# Patient Record
Sex: Male | Born: 1980
Health system: Southern US, Community
[De-identification: ages and names within clinical notes are randomized; demographics above are authoritative.]

## PROBLEM LIST (undated history)

## (undated) DIAGNOSIS — J309 Allergic rhinitis, unspecified: Secondary | ICD-10-CM

## (undated) DIAGNOSIS — G4730 Sleep apnea, unspecified: Secondary | ICD-10-CM

## (undated) HISTORY — DX: Allergic rhinitis, unspecified: J30.9

## (undated) HISTORY — DX: Sleep apnea, unspecified: G47.30

---
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
## 1999-02-26 ENCOUNTER — Emergency Department (HOSPITAL_COMMUNITY): Admission: EM | Admit: 1999-02-26 | Discharge: 1999-02-26 | Payer: Self-pay | Admitting: Emergency Medicine

## 2005-09-09 ENCOUNTER — Ambulatory Visit: Payer: Self-pay | Admitting: Family Medicine

## 2006-03-09 ENCOUNTER — Encounter: Payer: Self-pay | Admitting: Emergency Medicine

## 2011-01-25 ENCOUNTER — Ambulatory Visit (INDEPENDENT_AMBULATORY_CARE_PROVIDER_SITE_OTHER): Payer: BC Managed Care – PPO | Admitting: Internal Medicine

## 2011-01-25 ENCOUNTER — Encounter: Payer: Self-pay | Admitting: Internal Medicine

## 2011-01-25 DIAGNOSIS — K625 Hemorrhage of anus and rectum: Secondary | ICD-10-CM | POA: Insufficient documentation

## 2011-02-04 NOTE — Assessment & Plan Note (Signed)
Summary: BLOOD IN STOOL   Vital Signs:  Patient Profile:   30 Years Old Male CC:      blood in stool yesterday Height:     67 inches Weight:      240 pounds Temp:     98.8 degrees F Pulse rate:   92 / minute Resp:     14 per minute BP sitting:   130 / 85  (left arm)  Pt. in pain?   no                   Prior Medication List:  No prior medications documented  Current Allergies: No known allergies History of Present Illness Chief Complaint: blood in stool yesterday History of Present Illness: noticed blood in toilet water yesterday. Doesn't think it was on stool but stool has been looser as he has been on diet. Had a few episodes of blood on toilet in past years but thought it was from wiping too hard. No abd pain but sl rectal burning. Lifts alot at work and sits on toilet for long periods.  REVIEW OF SYSTEMS Constitutional Symptoms      Denies fever, chills, night sweats, weight loss, weight gain, and fatigue.  Eyes       Denies change in vision, eye pain, eye discharge, glasses, contact lenses, and eye surgery. Ear/Nose/Throat/Mouth       Denies hearing loss/aids, change in hearing, ear pain, ear discharge, dizziness, frequent runny nose, frequent nose bleeds, sinus problems, sore throat, hoarseness, and tooth pain or bleeding.  Respiratory       Denies dry cough, productive cough, wheezing, shortness of breath, asthma, bronchitis, and emphysema/COPD.  Cardiovascular       Denies murmurs, chest pain, and tires easily with exhertion.    Gastrointestinal       Complains of blood in bowel movements.      Denies stomach pain, nausea/vomiting, diarrhea, constipation, and indigestion. Genitourniary       Denies painful urination, kidney stones, and loss of urinary control. Neurological       Denies paralysis, seizures, and fainting/blackouts. Musculoskeletal       Denies muscle pain, joint pain, joint stiffness, decreased range of motion, redness, swelling, muscle  weakness, and gout.  Skin       Denies bruising, unusual mles/lumps or sores, and hair/skin or nail changes.  Psych       Denies mood changes, temper/anger issues, anxiety/stress, speech problems, depression, and sleep problems.  Past History:  Family History: Last updated: 01/25/2011 Family History Hypertension  Social History: Last updated: 01/25/2011 Former Smoker quit 2/12 Alcohol use-yes Drug use-no  Past Medical History: Unremarkable  Past Surgical History: Denies surgical history  Family History: Family History Hypertension  Social History: Former Smoker quit 2/12 Alcohol use-yes Drug use-no Smoking Status:  quit Drug Use:  no Physical Exam General appearance: well developed, well nourished, no acute distress Head: normocephalic, atraumatic Pupils: equal, round, reactive to light Oral/Pharynx: tongue normal, posterior pharynx without erythema or exudate Neck: neck supple,  trachea midline, no masses Chest/Lungs: no rales, wheezes, or rhonchi bilateral, breath sounds equal without effort Abdomen: soft, non-tender without obvious organomegaly. rectal without external abnl, rectal suboptimal due to pt tenseness. stool heme neg Extremities: normal extremities Neurological: grossly intact and non-focal Skin: no obvious rashes MSE: oriented to time, place, and person Assessment New Problems: RECTAL BLEEDING (ICD-569.3)   The patient and/or caregiver has been counseled thoroughly with regard to medications prescribed including dosage, schedule,  interactions, rationale for use, and possible side effects and they verbalize understanding.  Diagnoses and expected course of recovery discussed and will return if not improved as expected or if the condition worsens. Patient and/or caregiver verbalized understanding.   Patient Instructions: 1)  get hemoccult developed at [facility] and call me with results. 2)  Watch bm's and if stool is black or bloody, consult your  primary at [facility].  3)  During a lift of weight, inhale slowly. 4)  Don't spend time on toilet. 5)  If rectum burns, apply rectal cream

## 2011-02-09 NOTE — Letter (Signed)
Summary: History Form   History Form   Imported By: Eugenio Hoes 02/01/2011 12:08:07  _____________________________________________________________________  External Attachment:    Type:   Image     Comment:   External Document

## 2011-04-28 ENCOUNTER — Encounter: Payer: Self-pay | Admitting: Family Medicine

## 2011-05-11 ENCOUNTER — Ambulatory Visit: Payer: Self-pay | Admitting: Family Medicine

## 2011-07-06 ENCOUNTER — Telehealth: Payer: Self-pay | Admitting: *Deleted

## 2011-07-06 ENCOUNTER — Ambulatory Visit (INDEPENDENT_AMBULATORY_CARE_PROVIDER_SITE_OTHER): Payer: BC Managed Care – PPO | Admitting: Family Medicine

## 2011-07-06 ENCOUNTER — Encounter: Payer: Self-pay | Admitting: Family Medicine

## 2011-07-06 DIAGNOSIS — F17200 Nicotine dependence, unspecified, uncomplicated: Secondary | ICD-10-CM

## 2011-07-06 DIAGNOSIS — F419 Anxiety disorder, unspecified: Secondary | ICD-10-CM | POA: Insufficient documentation

## 2011-07-06 DIAGNOSIS — R631 Polydipsia: Secondary | ICD-10-CM | POA: Insufficient documentation

## 2011-07-06 DIAGNOSIS — F411 Generalized anxiety disorder: Secondary | ICD-10-CM

## 2011-07-06 DIAGNOSIS — R634 Abnormal weight loss: Secondary | ICD-10-CM

## 2011-07-06 DIAGNOSIS — IMO0001 Reserved for inherently not codable concepts without codable children: Secondary | ICD-10-CM

## 2011-07-06 LAB — CBC WITH DIFFERENTIAL/PLATELET
Basophils Absolute: 0 10*3/uL (ref 0.0–0.1)
Basophils Relative: 0.5 % (ref 0.0–3.0)
Eosinophils Absolute: 0 10*3/uL (ref 0.0–0.7)
Eosinophils Relative: 0.4 % (ref 0.0–5.0)
HCT: 47.9 % (ref 39.0–52.0)
Hemoglobin: 16 g/dL (ref 13.0–17.0)
Lymphocytes Relative: 22.1 % (ref 12.0–46.0)
Lymphs Abs: 1.5 10*3/uL (ref 0.7–4.0)
MCHC: 33.5 g/dL (ref 30.0–36.0)
MCV: 90.2 fl (ref 78.0–100.0)
Monocytes Absolute: 0.4 10*3/uL (ref 0.1–1.0)
Monocytes Relative: 5.2 % (ref 3.0–12.0)
Neutro Abs: 4.8 10*3/uL (ref 1.4–7.7)
Neutrophils Relative %: 71.8 % (ref 43.0–77.0)
Platelets: 278 10*3/uL (ref 150.0–400.0)
RBC: 5.32 Mil/uL (ref 4.22–5.81)
RDW: 12.8 % (ref 11.5–14.6)
WBC: 6.7 10*3/uL (ref 4.5–10.5)

## 2011-07-06 LAB — TSH: TSH: 0.78 u[IU]/mL (ref 0.35–5.50)

## 2011-07-06 LAB — COMPREHENSIVE METABOLIC PANEL
ALT: 33 U/L (ref 0–53)
AST: 26 U/L (ref 0–37)
Albumin: 4.9 g/dL (ref 3.5–5.2)
Alkaline Phosphatase: 48 U/L (ref 39–117)
BUN: 10 mg/dL (ref 6–23)
CO2: 28 mEq/L (ref 19–32)
Calcium: 9.7 mg/dL (ref 8.4–10.5)
Chloride: 103 mEq/L (ref 96–112)
Creatinine, Ser: 1 mg/dL (ref 0.4–1.5)
GFR: 115.94 mL/min (ref >60.00)
Glucose, Bld: 102 mg/dL — ABNORMAL HIGH (ref 70–99)
Potassium: 4.3 mEq/L (ref 3.5–5.1)
Sodium: 140 mEq/L (ref 135–145)
Total Bilirubin: 0.6 mg/dL (ref 0.3–1.2)
Total Protein: 7.8 g/dL (ref 6.0–8.3)

## 2011-07-06 LAB — HEMOGLOBIN A1C: Hgb A1c MFr Bld: 6.1 % (ref 4.6–6.5)

## 2011-07-06 MED ORDER — ALPRAZOLAM 0.5 MG PO TABS: 0.5000 mg | ORAL_TABLET | Freq: Every day | ORAL | Status: DC | PRN

## 2011-07-06 NOTE — Patient Instructions (Signed)
Eat small amounts of healthy food  Labs today  - and will update you  Avoid caffine and alcohol Keep working on quitting smoking  If you feel worse at any time let me know  Take xanax for extreme anxiety only if you need it  Keep exercising  Follow up with me in 1-2 weeks

## 2011-07-06 NOTE — Assessment & Plan Note (Signed)
This is new Worrisome for DM esp in light of wt loss  Lab today incl random sugar and a1c Disc at f/u

## 2011-07-06 NOTE — Telephone Encounter (Signed)
I will write him a work note for today and put in in IN box

## 2011-07-06 NOTE — Assessment & Plan Note (Signed)
New onset in last several months accompanied by nausea/ vomiting and wt loss  Also some mild depressive symptoms without SI  Labs today for thyroid/ cbc/ sugar - then f/u 1-2 wk to disc  Xanax px given for emergencies (and warnings given)- but pt will not likely take  Will make plan at f/u Enc exercise

## 2011-07-06 NOTE — Progress Notes (Signed)
Subjective:    Patient ID: Joseph Maddox, male    DOB: 1981-04-07, 30 y.o.   MRN: [id number]  HPI Here to re establish for care  Has not been here since he graduated high school   Has lost 17 lb since march - not on purpose  ? Eating less because he is nervous   Wanted to disc anxiety and sadness A highschool friend died in July 14, 2023 (not even a very close friend)  This ? Affected him  Then had an issue at work - with a collegue wkend of aug 10th -- he was taking care of his daughter and noticed he was more nervous than usual (31 yo and her brother 3y) Never been nervous like this before - just not right emotionally   Also feels sad at times -- emotional and feels like crying - for no reason (cried at church on Sunday)   Has been having trouble with nausea and vomiting for the past week  ? 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)  Starting to feel too nervous to drive   Quit smoking regular cigarettes in July - now is smoking small cigarellos -- with filters -- was down to 3 per day  Is getting off of that  Can no longer tolerate alcohol - used to drink regularly   He feels like his voice sounds different and things look different  No hallucinations / paranoia / or delusions that he is aware of   In family - aunt may have anxiety issues   No drugs at all  Takes vitamins and no otc meds Exercise is helping his anxiety -- P90X -- that helps him relax afterwards   Also extremely thirsty and urinates a lot  Frequent stools too   Patient Active Problem List  Diagnoses  . Anxiety  . Weight loss  . Excessive thirst   No past medical history on file. No past surgical history on file. History  Substance Use Topics  . Smoking status: Current Everyday Smoker    Types: Cigars  . Smokeless tobacco: Not on file  . Alcohol Use: Yes   Family History  Problem Relation Age of Onset  . Hypertension Other      Fam hx  . Hypertension Mother   . Hypertension Father   . Stroke Maternal Grandmother   . Cancer Maternal Grandfather     prostate CA   No Known Allergies No current outpatient prescriptions on file prior to visit.       Review of Systems Review of Systems  Constitutional: Negative for fever, appetite change, fatigue and unexpected weight change.  Eyes: Negative for pain and visual disturbance.  Respiratory: Negative for cough and shortness of breath.   Cardiovascular: Negative.  for cp but pos for racing heart occ when very nervous  Gastrointestinal: Negative for nausea, and constipation. and abd pain  Genitourinary: Negative for urgency and frequency.  Skin: Negative for pallor. or rash  Neurological: Negative for weakness, light-headedness, numbness and headaches.  Hematological: Negative for adenopathy. Does not bruise/bleed easily.  Psychiatric/Behavioral:pos for symptoms of anxiety and depression , neg for depression          Objective:   Physical Exam  Constitutional: He appears well-developed and well-nourished. No distress.       overwt and well appearing   HENT:  Head: Normocephalic and atraumatic.  Nose: Nose normal.  Mouth/Throat:  Oropharynx is clear and moist.  Eyes: Conjunctivae and EOM are normal. Pupils are equal, round, and reactive to light.  Neck: Normal range of motion. Neck supple. No JVD present. Carotid bruit is not present. No thyromegaly present.  Cardiovascular: Normal rate, regular rhythm, normal heart sounds and intact distal pulses.   No murmur heard. Pulmonary/Chest: Breath sounds normal. No respiratory distress. He has no wheezes. He exhibits no tenderness.  Abdominal: Soft. Bowel sounds are normal. He exhibits no distension and no mass. There is no tenderness.  Musculoskeletal: Normal range of motion. He exhibits no edema and no tenderness.  Lymphadenopathy:    He has no cervical adenopathy.  Neurological: He is alert. He has normal  reflexes. No cranial nerve deficit. Coordination normal.       No tremor  Nl sensation in fingers and toes   Skin: Skin is warm and dry. No rash noted. No erythema.  Psychiatric:       Pt seems mildly anxious and also with blunted affect  Overall talkative and with fair eye contact Good communication skills No SI          Assessment & Plan:

## 2011-07-06 NOTE — Telephone Encounter (Signed)
Patient notified as instructed by telephone. Pt said he did not need note for work he was wanting to know what he should do about his vomiting after he eats at work. Pt said he works from [place] until 5:30pm each day. Pt said not feeling anxious today but he ate a protein bar and drank ensure about 2:00pm and then he vomited at 3:45pm. Pt said he works in [employer] but the last week it has not been hot where he works. Pt said he is trying to eat smaller amts of food but he still vomited this afternoon. Please advise. Pt understands due to lateness of today he may not get call back until Wed. Pt can be reached at [phone] and uses [facility] if pharmacy needed.

## 2011-07-06 NOTE — Telephone Encounter (Signed)
I don't know what the treatment plan will be until I get his labs back and have him follow up  For now - advise him to get some zantac 150 mg otc and try it bid for stomach acid  I will get back to him about results and f/u as planned

## 2011-07-06 NOTE — Telephone Encounter (Signed)
Patient called to let Dr. Milinda Antis know that he is at work and is vomiting.  He stated that Dr. Milinda Antis did not give him a work note to be out of work and he wanted to know what he should do.  Please advise.

## 2011-07-06 NOTE — Telephone Encounter (Signed)
Patient notified as instructed by telephone. 

## 2011-07-06 NOTE — Assessment & Plan Note (Signed)
With mood change/ anx and dep and also some exercise and food intol Lab today Disc at f/u  Lost 17 lb since march

## 2011-07-13 ENCOUNTER — Telehealth: Payer: Self-pay | Admitting: *Deleted

## 2011-07-13 MED ORDER — ALPRAZOLAM 0.5 MG PO TABS: 0.5000 mg | ORAL_TABLET | Freq: Two times a day (BID) | ORAL | Status: DC | PRN

## 2011-07-13 NOTE — Telephone Encounter (Signed)
Pt is asking if he can take his xanax twice a day instead of once.  When he takes one a day it doesn't last the whole day.  He has tried taking half twice a day, but half doesn't work as well as a whole one.  Please advise.

## 2011-07-13 NOTE — Telephone Encounter (Signed)
[facility], patient advised as instructed via telephone.

## 2011-07-13 NOTE — Telephone Encounter (Signed)
That is ok until f/u  Px written for call in   Will disc further at f/u  Careful of sedation

## 2011-07-20 ENCOUNTER — Ambulatory Visit (INDEPENDENT_AMBULATORY_CARE_PROVIDER_SITE_OTHER): Payer: BC Managed Care – PPO | Admitting: Family Medicine

## 2011-07-20 ENCOUNTER — Encounter: Payer: Self-pay | Admitting: Family Medicine

## 2011-07-20 DIAGNOSIS — R7309 Other abnormal glucose: Secondary | ICD-10-CM

## 2011-07-20 DIAGNOSIS — R739 Hyperglycemia, unspecified: Secondary | ICD-10-CM

## 2011-07-20 DIAGNOSIS — R631 Polydipsia: Secondary | ICD-10-CM

## 2011-07-20 DIAGNOSIS — R634 Abnormal weight loss: Secondary | ICD-10-CM

## 2011-07-20 DIAGNOSIS — F419 Anxiety disorder, unspecified: Secondary | ICD-10-CM

## 2011-07-20 DIAGNOSIS — F411 Generalized anxiety disorder: Secondary | ICD-10-CM

## 2011-07-20 DIAGNOSIS — F17200 Nicotine dependence, unspecified, uncomplicated: Secondary | ICD-10-CM

## 2011-07-20 DIAGNOSIS — R7303 Prediabetes: Secondary | ICD-10-CM | POA: Insufficient documentation

## 2011-07-20 MED ORDER — BUSPIRONE HCL 15 MG PO TABS: 7.5000 mg | ORAL_TABLET | Freq: Two times a day (BID) | ORAL | Status: DC

## 2011-07-20 NOTE — Progress Notes (Signed)
Subjective:    Patient ID: Joseph Maddox, male    DOB: 05/12/1981, 30 y.o.   MRN: [id number]  HPI Here for f/u of anxiety and wt loss and hyperglycemia   Last time - did full lab prof for anx and n/v Labs all nl except for hyperglycemia with a1c of 6.1  Was having excessive thirst   Xanax - for anx has helped  Was apprehensive about taking it at first  It really calms down his anxiety - takes it twice  No mental illness in the family  Overall sadness has been better too  Vomiting and stomach problems are better   No stressors - in terms of counseling   He does have some snoring - ? Poss apnea Not tired during the day    Wt is down 3 more lb He did some research on DM and started eating 6 small meals per day - he does feel better with that  Has a protien supplement if she misses a meal  Eats a lot of fruit  More protien and fruit and veg -much healthier Has plan to start exercise   Patient Active Problem List  Diagnoses  . Anxiety  . Weight loss  . Excessive thirst  . Smoking  . Hyperglycemia   No past medical history on file. No past surgical history on file. History  Substance Use Topics  . Smoking status: Current Everyday Smoker    Types: Cigars  . Smokeless tobacco: Not on file  . Alcohol Use: Yes   Family History  Problem Relation Age of Onset  . Hypertension Other     Fam hx  . Hypertension Mother   . Hypertension Father   . Stroke Maternal Grandmother   . Cancer Maternal Grandfather     prostate CA   No Known Allergies Current Outpatient Prescriptions on File Prior to Visit  Medication Sig Dispense Refill  . ALPRAZolam (XANAX) 0.5 MG tablet Take 1 tablet (0.5 mg total) by mouth 2 (two) times daily as needed for anxiety.  30 tablet  0  . Ascorbic Acid (VITAMIN C) 1000 MG tablet Take 1,000 mg by mouth daily.        . cyanocobalamin 2000 MCG tablet Take 2,000 mcg by mouth daily.        . Multiple Vitamin (MULTIVITAMIN) tablet Take 2 tablets  by mouth daily.             Review of Systems Review of Systems  Constitutional: Negative for fever, appetite change, fatigue and unexpected weight change.  Eyes: Negative for pain and visual disturbance.  ENT pos for some allergy sinus congestion  Respiratory: Negative for cough and shortness of breath.   Cardiovascular: Negative for cp or palpitations    Gastrointestinal: Negative for nausea, diarrhea and constipation.  Genitourinary: Negative for urgency and frequency.  Skin: Negative for pallor or rash   Neurological: Negative for weakness, light-headedness, numbness and headaches.  Hematological: Negative for adenopathy. Does not bruise/bleed easily.  Psychiatric/Behavioral: Negative for dysphoric mood. The patient is not nervous/anxious.          Objective:   Physical Exam  Constitutional: He appears well-developed and well-nourished. No distress.       overwt and well appearing   HENT:  Head: Normocephalic and atraumatic.  Mouth/Throat: Oropharynx is clear and moist.  Eyes: Conjunctivae and EOM are normal. Pupils are equal, round, and reactive to light.  Neck: Normal range of motion. Neck supple. No JVD present. Carotid bruit  is not present. No thyromegaly present.  Cardiovascular: Normal rate, regular rhythm, normal heart sounds and intact distal pulses.   Pulmonary/Chest: Effort normal and breath sounds normal. No respiratory distress. He has no wheezes.  Abdominal: Soft. Bowel sounds are normal. He exhibits no distension and no mass. There is no tenderness.  Musculoskeletal: He exhibits no edema and no tenderness.  Lymphadenopathy:    He has no cervical adenopathy.  Neurological: He is alert. He has normal reflexes. No cranial nerve deficit. Coordination normal.       No tremor   Skin: Skin is warm and dry. No rash noted. No erythema. No pallor.  Psychiatric: He has a normal mood and affect.       Much more relaxed - smiles at times Good eye contact and  communication skills           Assessment & Plan:

## 2011-07-20 NOTE — Assessment & Plan Note (Signed)
This has improved and GI issues resolved with xanax  Will transition over to buspar 7.5 bid and gradually stop xanax  Disc poss side eff and will update F/u 6-8 weeks Will begin back with exercise too

## 2011-07-20 NOTE — Assessment & Plan Note (Signed)
Rev labs in detail with pt  Disc imp of wt loss - low glycemic diet and exercise Disc plan for this  F/u 6-8 weeks and will continue to follow

## 2011-07-20 NOTE — Assessment & Plan Note (Signed)
Likely with hyperglycemia  This has imp with better diet

## 2011-07-20 NOTE — Assessment & Plan Note (Signed)
Overall is starting to loose intentionally with better low glycemic diet  Enc this

## 2011-07-20 NOTE — Assessment & Plan Note (Signed)
Disc in detail risks of smoking and possible outcomes including copd, vascular/ heart disease, cancer , respiratory and sinus infections  Pt voices understanding He is cutting back and doing well with less anxiety

## 2011-07-20 NOTE — Patient Instructions (Signed)
Eat small frequent meals with protien  Avoid sugar and sweets  Start working out at least 5 days per week  Take a multivitamin without caffiene  Start buspar twice daily for anxiety and depression  If any side effects or if you feel worse - let me know Gradually get off the xanax - as you feel better  Follow up with me in 6-8 weeks (or earlier if needed )  Also - keep working on quitting smoking

## 2011-07-21 ENCOUNTER — Telehealth: Payer: Self-pay | Admitting: *Deleted

## 2011-07-21 NOTE — Telephone Encounter (Signed)
That is a common side effect and usually goes away quickly -- but let me know if it worsens or other problems thanks

## 2011-07-21 NOTE — Telephone Encounter (Signed)
Patient took Buspar last night and again this morning.  He says he felt "wierd" this morning after taking it, kind of like he was "high" but it only lasted a short while.  I told him that it would probably take some time for his body to adjust to it.  He was fine with that, just curious if this could be the effects of the medicine.  Please advise otherwise.

## 2011-07-21 NOTE — Telephone Encounter (Signed)
Patient notified as instructed by telephone. 

## 2011-07-21 NOTE — Telephone Encounter (Signed)
Patient says he started the Buspar and wants to know how it makes you feel.

## 2011-07-26 ENCOUNTER — Telehealth: Payer: Self-pay | Admitting: *Deleted

## 2011-07-26 MED ORDER — ALPRAZOLAM ER 0.5 MG PO TB24: 0.5000 mg | ORAL_TABLET | ORAL | Status: DC

## 2011-07-26 NOTE — Telephone Encounter (Signed)
I'm hoping that his buspar will start working so he will need xanax less In meantime we can change to long acting xanax xr for longer lasting releif Warm - like the other this is habit forming so use only when necessary PLAN:

## 2011-07-26 NOTE — Telephone Encounter (Signed)
[facility] Triage Call Report Triage Record Num: [id number] Operator: Ether Griffins Patient Name: Joseph Maddox Call Date & Time: 07/23/2011 6:12:59PM Patient Phone: [phone] PCP: Evelena Peat Patient Gender: Male PCP Fax : [phone] Patient DOB: [birth year] Practice Name: [facility] Reason for Call: Calling about starting on Xanax 07/06/11 and Buspar 07/20/11. Feels good when he first takes meds but doesn't feel good when it wears off--hard to breathe due to anxiety but then feels fine when taking Xanax. All emergent sxs of Allergic reaction r/o.Home care advice given.Advised to call office back if sxs get worse. Protocol(s) Used: Allergic Reaction, Severe Recommended Outcome per Protocol: Provide Home/Self Care Reason for Outcome: All other situations Care Advice: ~ 07/23/2011 6:29:46PM Page 1 of 1 [id number]

## 2011-07-26 NOTE — Telephone Encounter (Signed)
Pt states xanax is not helping like it was, not lasting as long and wearing off sooner.  He's taking 2 a day.  Should he increase his dose?  Please advise.

## 2011-07-26 NOTE — Telephone Encounter (Signed)
Tell pt I reviewed his note -- from call a nurse  Continue buspar and take xanax when he needs it also - hopefully as time goes on he will need less and less xanax  I want to give it some more time But update me if further changes

## 2011-07-26 NOTE — Telephone Encounter (Signed)
Patient notified as instructed by telephone. 

## 2011-07-27 NOTE — Telephone Encounter (Signed)
Patient notified as instructed by telephone. Pt advised not to take Xanax and Xanax XR together. Pt to try Xanax XR. Medication phoned to [facility]. pharmacy as instructed.

## 2011-07-28 ENCOUNTER — Telehealth: Payer: Self-pay | Admitting: *Deleted

## 2011-07-28 MED ORDER — ALPRAZOLAM 0.5 MG PO TABS: 0.5000 mg | ORAL_TABLET | Freq: Three times a day (TID) | ORAL | Status: DC | PRN

## 2011-07-28 MED ORDER — BUSPIRONE HCL 15 MG PO TABS: 15.0000 mg | ORAL_TABLET | Freq: Two times a day (BID) | ORAL | Status: DC

## 2011-07-28 NOTE — Telephone Encounter (Signed)
Patient notified as instructed by telephone. Pt said he does not have the money to pick up the Buspar today. Pt said he is presently taking Buspar 7.5 mg one tab bid. Pt will increase Buspar 7.5mg  to two tabs bid. Spoke with Zella Ball at [facility] and Buspar 15mg  not available now so if pt takes two 7.5 mg tabs twice a day that will equal what Dr Milinda Antis wants pt to take. Zella Ball will profile Buspar 15 mg in computer for future refills.

## 2011-07-28 NOTE — Telephone Encounter (Signed)
Then go ahead and increase buspar to one whole pill bid (currently on 1/2 bid) and will see how he is doing by his next appt Px written for call in

## 2011-07-28 NOTE — Telephone Encounter (Signed)
Patient notified as instructed by telephone. Pt said he cannot tell if Buspar is working or not but he does not feel like having any side effects from Buspar. Medication phoned to [facility]. pharmacy as instructed.

## 2011-07-28 NOTE — Telephone Encounter (Signed)
Pt's xanax was changed to XR.  He took one this morning and doesn't like the way it makes him feel.  It has made him sleepy, anxious.  The plain didn't make him feel this way, it just didn't last long enough.  He doesn't like this, this makes him feel high.

## 2011-07-28 NOTE — Telephone Encounter (Signed)
Ok- then stop the xanax xr  Go back to the plain xanax .5 and can take 3 times per day instead of 2  Watch out for sedation I will write a new px for call in  Ask him if he thinks the buspar is working at all and if he is having any side effects also

## 2011-08-03 ENCOUNTER — Telehealth: Payer: Self-pay | Admitting: Family Medicine

## 2011-08-03 NOTE — Telephone Encounter (Signed)
I'm glad he is feeling better That amount of fish is ok

## 2011-08-03 NOTE — Telephone Encounter (Signed)
Pt called to notify doctor that he is eating 2 cans of 3 oz Tuna and serving of Tilapia and Salmon daily and was calling to ask if this was okay for him and mercury levels.  Please call patient to discuss at [phone].

## 2011-08-03 NOTE — Telephone Encounter (Signed)
Spoke with pt and the 2 cans of Tuna at lunch at a serving of Tilapia and/or Salmon at supper meal is basically all pt is eating right now. Pt wants to make sure that is OK with Dr Milinda Antis. Pt said he is still taking Buspar 7.5 mg twice a day and has not taken Xanax since last week and is feeling better.

## 2011-08-03 NOTE — Telephone Encounter (Signed)
Patient notified as instructed by telephone. Pt wondered if needed to stick with diet all the time and I looked at last OV and pt is to follow low glycemic diet.

## 2011-08-18 ENCOUNTER — Telehealth: Payer: Self-pay | Admitting: *Deleted

## 2011-08-18 NOTE — Telephone Encounter (Signed)
Patient notified as instructed by telephone. 

## 2011-08-18 NOTE — Telephone Encounter (Signed)
Pt states he has been taking 7.5 mg's of buspar twice a day.  He has not increased to 15 mg's twice a day yet, and wonders if he needs to.  I asked him how he feels and he said he is not taking xanax anymore.  He thinks the buspar is working but says he has been irritable lately and has been smoking more.  He wants your opinion on whether he should increase his dose.

## 2011-08-18 NOTE — Telephone Encounter (Signed)
Tell him to go ahead and try the increase to 15 bid --if it does not agree with him he can drop back, but I think it may help He has f/u later this month

## 2011-08-31 ENCOUNTER — Encounter: Payer: Self-pay | Admitting: Family Medicine

## 2011-08-31 ENCOUNTER — Ambulatory Visit (INDEPENDENT_AMBULATORY_CARE_PROVIDER_SITE_OTHER): Payer: BC Managed Care – PPO | Admitting: Family Medicine

## 2011-08-31 VITALS — BP 104/78 | HR 76 | Temp 98.4°F | Ht 64.75 in | Wt 218.2 lb

## 2011-08-31 DIAGNOSIS — Z23 Encounter for immunization: Secondary | ICD-10-CM

## 2011-08-31 DIAGNOSIS — F411 Generalized anxiety disorder: Secondary | ICD-10-CM

## 2011-08-31 DIAGNOSIS — R0981 Nasal congestion: Secondary | ICD-10-CM

## 2011-08-31 DIAGNOSIS — F419 Anxiety disorder, unspecified: Secondary | ICD-10-CM

## 2011-08-31 DIAGNOSIS — R7309 Other abnormal glucose: Secondary | ICD-10-CM

## 2011-08-31 DIAGNOSIS — R739 Hyperglycemia, unspecified: Secondary | ICD-10-CM

## 2011-08-31 DIAGNOSIS — F17200 Nicotine dependence, unspecified, uncomplicated: Secondary | ICD-10-CM

## 2011-08-31 DIAGNOSIS — J3489 Other specified disorders of nose and nasal sinuses: Secondary | ICD-10-CM

## 2011-08-31 NOTE — Patient Instructions (Addendum)
Work up to more days of exercise per week Stay on the buspar 15 mg twice daily  Work on smoking cessation  Flu shot today  Schedule follow up in 6 months with labs prior  claritin or allegra are ok for allergy/ mucous problems , also nasal saline spray

## 2011-08-31 NOTE — Progress Notes (Signed)
Subjective:    Patient ID: Joseph Maddox, male    DOB: Sep 12, 1981, 30 y.o.   MRN: [id number]  HPI Here for f/u of anxiety and hyperglycemia and smoking   Wt is down 2 lb with bmi of 36  Diet - on point-- doing much better than he was - small frequent meals   Exercise 2 days per week and working on increasing it  Hard to find the time   Hyperglycemia - working on that with diet - and it is going well  Feels better without sugar   Smoking status- up and down -- smokes cigarellos -- is feeling relaxed enough to cut back and quit again  No cough or trouble breathing  Is ok with    Anxiety -- was on xanax and transitioned over to buspar On oct 11- started increase of buspar 15 mg bid - and feeling better  No longer needing xanax  Overall feeling much much better  Every once in a while a little fuzzy- but it goes away  No side effects overall  Is affirming his stress and working better with it    Is having some allergy congestion and cough with mucous occas  What can he try otc?  All mucous is clear No sinus pain or fever  Patient Active Problem List  Diagnoses  . Anxiety  . Weight loss  . Excessive thirst  . Smoking  . Hyperglycemia  . Congestion of nasal sinus   No past medical history on file. No past surgical history on file. History  Substance Use Topics  . Smoking status: Current Everyday Smoker    Types: Cigars  . Smokeless tobacco: Not on file  . Alcohol Use: Yes   Family History  Problem Relation Age of Onset  . Hypertension Other     Fam hx  . Hypertension Mother   . Hypertension Father   . Stroke Maternal Grandmother   . Cancer Maternal Grandfather     prostate CA   No Known Allergies Current Outpatient Prescriptions on File Prior to Visit  Medication Sig Dispense Refill  . Ascorbic Acid (VITAMIN C) 1000 MG tablet Take 1,000 mg by mouth daily.        . cyanocobalamin 2000 MCG tablet Take 2,000 mcg by mouth daily.        . Multiple  Vitamin (MULTIVITAMIN) tablet Take 1 tablet by mouth daily.       Marland Kitchen ALPRAZolam (XANAX) 0.5 MG tablet Take 1 tablet (0.5 mg total) by mouth 3 (three) times daily as needed for sleep or anxiety.  90 tablet  0       Review of Systems Review of Systems  Constitutional: Negative for fever, appetite change, fatigue and unexpected weight change.  Eyes: Negative for pain and visual disturbance.  ENT pos for runny and stuffy nose, no ST  Respiratory: Negative for cough and shortness of breath.   Cardiovascular: Negative for cp or palpitations    Gastrointestinal: Negative for nausea, diarrhea and constipation.  Genitourinary: Negative for urgency and frequency.  Skin: Negative for pallor or rash   Neurological: Negative for weakness, light-headedness, numbness and headaches.  Hematological: Negative for adenopathy. Does not bruise/bleed easily.  Psychiatric/Behavioral: Negative for dysphoric mood. The patient is much less anxious           Objective:   Physical Exam  Constitutional: He appears well-developed and well-nourished. No distress.  HENT:  Head: Normocephalic and atraumatic.  Mouth/Throat: Oropharynx is clear and moist.  Nares are boggy and pale No sinus tenderness  Eyes: Conjunctivae and EOM are normal. Pupils are equal, round, and reactive to light.  Neck: Normal range of motion. Neck supple. No tracheal deviation present. No thyromegaly present.  Cardiovascular: Normal rate, regular rhythm, normal heart sounds and intact distal pulses.   Pulmonary/Chest: Effort normal and breath sounds normal. No respiratory distress. He has no wheezes.       Diffusely distant bs   Musculoskeletal: Normal range of motion. He exhibits no edema and no tenderness.  Lymphadenopathy:    He has no cervical adenopathy.  Neurological: He is alert. He has normal reflexes. No cranial nerve deficit. Coordination normal.  Skin: Skin is warm and dry. No rash noted. No erythema. No pallor.    Psychiatric: He has a normal mood and affect.       Much improved anxiety- at ease/ talkative          Assessment & Plan:

## 2011-09-05 NOTE — Assessment & Plan Note (Signed)
Suspect allergies/hayfever Recommend otc antihist and nasal saline spray Update if worse or not imp

## 2011-09-05 NOTE — Assessment & Plan Note (Signed)
Doing well with exercise and low glycemic diet  Commended

## 2011-09-05 NOTE — Assessment & Plan Note (Signed)
Much imp with buspar 15 bid  No longer on xanax Disc coping tech and exercise for stress

## 2011-09-19 ENCOUNTER — Other Ambulatory Visit: Payer: Self-pay | Admitting: Family Medicine

## 2011-09-20 NOTE — Telephone Encounter (Signed)
[facility] request refill Buspar 7.5 mg. Since Dr Milinda Antis is out of office and is not on her computer today can you please advise about refill?

## 2011-09-21 ENCOUNTER — Other Ambulatory Visit: Payer: Self-pay | Admitting: Family Medicine

## 2011-09-21 NOTE — Telephone Encounter (Signed)
I'm confused, was this already done today?

## 2011-09-21 NOTE — Telephone Encounter (Signed)
rx sent

## 2011-10-20 ENCOUNTER — Other Ambulatory Visit: Payer: Self-pay | Admitting: Family Medicine

## 2011-10-21 NOTE — Telephone Encounter (Signed)
Please ask pt if he is taking 2 of the 7.5 buspar bid or one 15 bid --since the pharmacy is asking for refil  thanks

## 2011-10-22 NOTE — Telephone Encounter (Signed)
Please verify with pt whether he is taking the 15 mg bid or 2 of the 7.5s -- I will refer whichever he prefers-thanks

## 2011-10-22 NOTE — Telephone Encounter (Signed)
OK to refill

## 2011-10-22 NOTE — Telephone Encounter (Signed)
rx sent to pharmacy by e-script Uses 7.5

## 2011-10-26 ENCOUNTER — Other Ambulatory Visit: Payer: Self-pay | Admitting: *Deleted

## 2011-10-26 NOTE — Telephone Encounter (Signed)
I think I just did this 12/12 ?

## 2011-10-29 NOTE — Telephone Encounter (Signed)
Yes it was done.

## 2011-11-18 ENCOUNTER — Other Ambulatory Visit: Payer: Self-pay | Admitting: Family Medicine

## 2011-11-18 NOTE — Telephone Encounter (Signed)
[facility] 15 [facility] and that is why 12/12 refill was for Buspar 7.5 mg taking 2 bid. [facility] and [facility] request 15 mg. So Buspar 15 mg #60 x 4 [facility] [address]

## 2012-02-14 ENCOUNTER — Telehealth: Payer: Self-pay

## 2012-02-14 NOTE — Telephone Encounter (Signed)
Since weight lifting supplements are generally not studied or approved by the FDA- I cannot tell him weather there could be interaction .... I'm not a huge fan of those kind of medications in general  I adv he stop those supplements but bring them to his f/u with me so I can review the ingredients  thanks

## 2012-02-14 NOTE — Telephone Encounter (Signed)
Pt takes Buspar total of 30 mg daily. Pt concerned if interaction if pt starts to take wt lifting supplements: HMB and glutamine and Branch chain amino acid. Pt already has appt scheduled with Dr Milinda Antis 03/01/12. Pt last seen 08/31/11. Pt can be reached [phone].

## 2012-02-14 NOTE — Telephone Encounter (Signed)
Patient advised as instructed via telephone, will discuss with Dr. Milinda Antis at f/u appt.

## 2012-03-01 ENCOUNTER — Encounter: Payer: Self-pay | Admitting: Family Medicine

## 2012-03-01 ENCOUNTER — Ambulatory Visit (INDEPENDENT_AMBULATORY_CARE_PROVIDER_SITE_OTHER): Payer: BC Managed Care – PPO | Admitting: Family Medicine

## 2012-03-01 VITALS — BP 112/78 | HR 81 | Temp 98.8°F | Ht 64.75 in | Wt 246.2 lb

## 2012-03-01 DIAGNOSIS — F411 Generalized anxiety disorder: Secondary | ICD-10-CM

## 2012-03-01 DIAGNOSIS — Z87891 Personal history of nicotine dependence: Secondary | ICD-10-CM

## 2012-03-01 DIAGNOSIS — R739 Hyperglycemia, unspecified: Secondary | ICD-10-CM

## 2012-03-01 DIAGNOSIS — R7309 Other abnormal glucose: Secondary | ICD-10-CM

## 2012-03-01 DIAGNOSIS — F419 Anxiety disorder, unspecified: Secondary | ICD-10-CM

## 2012-03-01 NOTE — Assessment & Plan Note (Signed)
Much imp with buspar and better health habits  Will get back to diet / exercise for 3 mo  Then if doing well and stress level is stable - will work on getting off the buspar

## 2012-03-01 NOTE — Patient Instructions (Addendum)
Start at the gym , be cautious with supplements  Great job quitting smoking  Watch diet and work on [employer] loss  Schedule follow up with labs prior in 3 months  If all is going well we will work on tapering down the buspar

## 2012-03-01 NOTE — Progress Notes (Signed)
Subjective:    Patient ID: Joseph Maddox, male    DOB: [birth year], 31 y.o.   MRN: [id number]  HPI Here for f/u of anxiety and smoking and hyperglycemia  Wt is up 28 lb with bmi of 41  On buspar 15 mg bid  Anxiety-- doing great - anxiety under good control  Feeling more like himself  Would like to start weaning off of it soon  Not a lot of stress currently   Smoking -- quit in October -- is very proud  Was difficult at first and used his faith to get him through Also no alcohol since august  immed gained 20 lb after quitting smoking - and now is intent on getting it off   Hyperglycemia  Last a1c 6.1 Wt gain- noted- working on getting it off  Exercise- just started going to the gym 2 weeks ago  May be getting some hyperglycemia  Has cut out sweets completely -and only drinking water and crystal light -- that started recently   Patient Active Problem List  Diagnoses  . Anxiety  . Hyperglycemia  . Quit smoking   No past medical history on file. No past surgical history on file. History  Substance Use Topics  . Smoking status: Current Everyday Smoker    Types: Cigars  . Smokeless tobacco: Not on file  . Alcohol Use: Yes   Family History  Problem Relation Age of Onset  . Hypertension Other     Fam hx  . Hypertension Mother   . Hypertension Father   . Stroke Maternal Grandmother   . Cancer Maternal Grandfather     prostate CA   No Known Allergies Current Outpatient Prescriptions on File Prior to Visit  Medication Sig Dispense Refill  . busPIRone (BUSPAR) 15 MG tablet TAKE 1 TABLET TWICE A DAY  60 tablet  4  . Multiple Vitamin (MULTIVITAMIN) tablet Take 1 tablet by mouth daily.             Review of Systems Review of Systems  Constitutional: Negative for fever, appetite change, fatigue and unexpected weight change.  Eyes: Negative for pain and visual disturbance.  Respiratory: Negative for cough and shortness of breath.   Cardiovascular: Negative  for cp or palpitations    Gastrointestinal: Negative for nausea, diarrhea and constipation.  Genitourinary: Negative for urgency and frequency.  Skin: Negative for pallor or rash   Neurological: Negative for weakness, light-headedness, numbness and headaches.  Hematological: Negative for adenopathy. Does not bruise/bleed easily.  Psychiatric/Behavioral: Negative for dysphoric mood. The patient is not nervous/anxious.  (this is much improved)        Objective:   Physical Exam  Constitutional: He appears well-developed and well-nourished. No distress.  HENT:  Head: Normocephalic and atraumatic.  Mouth/Throat: Oropharynx is clear and moist.  Eyes: Conjunctivae and EOM are normal. Pupils are equal, round, and reactive to light. No scleral icterus.  Neck: Normal range of motion. Neck supple. No JVD present. Carotid bruit is not present. No thyromegaly present.  Cardiovascular: Normal rate, regular rhythm, normal heart sounds and intact distal pulses.  Exam reveals no gallop.   Pulmonary/Chest: Effort normal and breath sounds normal. No respiratory distress. He has no wheezes.       Good air exch  Abdominal: Soft. Bowel sounds are normal. He exhibits no distension, no abdominal bruit and no mass. There is no tenderness.  Musculoskeletal: Normal range of motion. He exhibits no edema.  Lymphadenopathy:    He has no cervical  adenopathy.  Neurological: He is alert. He has normal reflexes. He displays no tremor. No cranial nerve deficit. He exhibits normal muscle tone. Coordination normal.  Skin: Skin is warm and dry. No rash noted. No erythema. No pallor.  Psychiatric: He has a normal mood and affect.          Assessment & Plan:

## 2012-03-01 NOTE — Assessment & Plan Note (Signed)
Disc low glycemic diet and exercise for wt loss  ? Some hypoglycemia now and then Disc small frequent meals with protein Has gym regimen started Lab and f/u 3 mo

## 2012-03-01 NOTE — Assessment & Plan Note (Signed)
Doing great- commended on this Will work on loosing the weight he gained next

## 2012-05-13 ENCOUNTER — Other Ambulatory Visit: Payer: Self-pay | Admitting: Family Medicine

## 2012-05-15 ENCOUNTER — Other Ambulatory Visit: Payer: Self-pay

## 2012-05-15 MED ORDER — BUSPIRONE HCL 15 MG PO TABS: 15.0000 mg | ORAL_TABLET | Freq: Two times a day (BID) | ORAL | Status: DC

## 2012-05-15 NOTE — Telephone Encounter (Signed)
Ok to refill 

## 2012-05-15 NOTE — Telephone Encounter (Signed)
Will refill electronically  

## 2012-05-25 ENCOUNTER — Other Ambulatory Visit (INDEPENDENT_AMBULATORY_CARE_PROVIDER_SITE_OTHER): Payer: BC Managed Care – PPO

## 2012-05-25 DIAGNOSIS — R7309 Other abnormal glucose: Secondary | ICD-10-CM

## 2012-05-25 DIAGNOSIS — R739 Hyperglycemia, unspecified: Secondary | ICD-10-CM

## 2012-05-25 LAB — COMPREHENSIVE METABOLIC PANEL
ALT: 31 U/L (ref 0–53)
Alkaline Phosphatase: 44 U/L (ref 39–117)
CO2: 29 mEq/L (ref 19–32)
Creatinine, Ser: 1 mg/dL (ref 0.4–1.5)
GFR: 110.03 mL/min (ref >60.00)
Glucose, Bld: 80 mg/dL (ref 70–99)
Total Bilirubin: 0.3 mg/dL (ref 0.3–1.2)

## 2012-05-25 LAB — LIPID PANEL
HDL: 46.9 mg/dL (ref >39.00)
Triglycerides: 152 mg/dL — ABNORMAL HIGH (ref 0.0–149.0)

## 2012-05-25 LAB — HEMOGLOBIN A1C: Hgb A1c MFr Bld: 5.9 % (ref 4.6–6.5)

## 2012-05-31 ENCOUNTER — Ambulatory Visit (INDEPENDENT_AMBULATORY_CARE_PROVIDER_SITE_OTHER): Payer: BC Managed Care – PPO | Admitting: Family Medicine

## 2012-05-31 ENCOUNTER — Encounter: Payer: Self-pay | Admitting: Family Medicine

## 2012-05-31 VITALS — BP 130/78 | HR 94 | Temp 97.8°F | Ht 65.5 in | Wt 237.0 lb

## 2012-05-31 DIAGNOSIS — F411 Generalized anxiety disorder: Secondary | ICD-10-CM

## 2012-05-31 DIAGNOSIS — R7309 Other abnormal glucose: Secondary | ICD-10-CM

## 2012-05-31 DIAGNOSIS — F419 Anxiety disorder, unspecified: Secondary | ICD-10-CM

## 2012-05-31 DIAGNOSIS — R739 Hyperglycemia, unspecified: Secondary | ICD-10-CM

## 2012-05-31 NOTE — Assessment & Plan Note (Signed)
This is much improved with less stress and better self care Will start to wean buspar as tolerated (see AVS) and let me know if any problems  F/u 6 mo

## 2012-05-31 NOTE — Patient Instructions (Addendum)
Keep working on the exercise and diet and weight loss- good job! Cut buspar in 1/2 and just take 1/2 pill twice daily for 2 weeks  Then cut to 1/2 pill once daily for 1 week and then stop  At any time if you feel worse - go back to the old dose and keep me updated Follow up with me in 6 months

## 2012-05-31 NOTE — Progress Notes (Signed)
Subjective:    Patient ID: Joseph Maddox, male    DOB: Jan 01, 1981, 31 y.o.   MRN: [id number]  HPI Here for f/u of anxiety and hyperglycemia   Wt is down 9 lb with bmi of 38 Lifestyle changes - working out - going to the gym and helping to train his cousin too  He will get back to the gym himself next month Is staying away from sweets Eating [place] and fish and avoiding fried food   a1c is now down to 5.9- reassuring  Lab Results  Component Value Date   CHOL 157 05/25/2012   HDL 46.90 05/25/2012   LDLCALC 80 05/25/2012   TRIG 152.0* 05/25/2012   CHOLHDL 3 05/25/2012    Anxiety-- a lot better with that  No problems Wants to start weaning the buspar  Not as much stress  Patient Active Problem List  Diagnosis  . Anxiety  . Hyperglycemia  . Quit smoking   No past medical history on file. No past surgical history on file. History  Substance Use Topics  . Smoking status: Former Smoker    Types: Cigars  . Smokeless tobacco: Not on file  . Alcohol Use: No   Family History  Problem Relation Age of Onset  . Hypertension Other     Fam hx  . Hypertension Mother   . Hypertension Father   . Stroke Maternal Grandmother   . Cancer Maternal Grandfather     prostate CA   No Known Allergies Current Outpatient Prescriptions on File Prior to Visit  Medication Sig Dispense Refill  . busPIRone (BUSPAR) 15 MG tablet Take 1 tablet (15 mg total) by mouth 2 (two) times daily.  60 tablet  11  . Multiple Vitamin (MULTIVITAMIN) tablet Take 1 tablet by mouth daily.           Review of Systems Review of Systems  Constitutional: Negative for fever, appetite change, fatigue and unexpected weight change.  Eyes: Negative for pain and visual disturbance.  Respiratory: Negative for cough and shortness of breath.   Cardiovascular: Negative for cp or palpitations    Gastrointestinal: Negative for nausea, diarrhea and constipation.  Genitourinary: Negative for urgency and frequency.    Skin: Negative for pallor or rash   Neurological: Negative for weakness, light-headedness, numbness and headaches.  Hematological: Negative for adenopathy. Does not bruise/bleed easily.  Psychiatric/Behavioral: Negative for dysphoric mood. The patient is not nervous/anxious.  (this is much improved)       Objective:   Physical Exam  Constitutional: He appears well-developed and well-nourished. No distress.  HENT:  Head: Normocephalic and atraumatic.  Mouth/Throat: Oropharynx is clear and moist.  Eyes: Conjunctivae and EOM are normal. Pupils are equal, round, and reactive to light. No scleral icterus.  Neck: Normal range of motion. Neck supple. No JVD present. No thyromegaly present.  Cardiovascular: Normal rate, regular rhythm, normal heart sounds and intact distal pulses.  Exam reveals no gallop.   Pulmonary/Chest: Effort normal and breath sounds normal. No respiratory distress. He has no wheezes.  Abdominal: Soft. Bowel sounds are normal. He exhibits no distension and no mass. There is no tenderness.  Musculoskeletal: He exhibits no edema.  Lymphadenopathy:    He has no cervical adenopathy.  Neurological: He is alert. He has normal reflexes.  Skin: Skin is warm and dry. No rash noted. No erythema. No pallor.  Psychiatric: He has a normal mood and affect. Judgment normal. His mood appears not anxious. His affect is not blunt and not  labile. Cognition and memory are normal. He does not exhibit a depressed mood.          Assessment & Plan:

## 2012-05-31 NOTE — Assessment & Plan Note (Signed)
Better with diet and exercise and wt loss with a1c under 6 Commended!  Urged to keep it up  F/u 6 mo

## 2012-12-01 ENCOUNTER — Ambulatory Visit: Payer: BC Managed Care – PPO | Admitting: Family Medicine

## 2012-12-01 DIAGNOSIS — Z0289 Encounter for other administrative examinations: Secondary | ICD-10-CM

## 2013-02-16 ENCOUNTER — Ambulatory Visit (INDEPENDENT_AMBULATORY_CARE_PROVIDER_SITE_OTHER): Payer: Commercial Indemnity | Admitting: Family Medicine

## 2013-02-16 ENCOUNTER — Encounter: Payer: Self-pay | Admitting: Family Medicine

## 2013-02-16 VITALS — BP 130/92 | HR 108 | Temp 99.0°F | Ht 65.5 in | Wt 254.5 lb

## 2013-02-16 DIAGNOSIS — E669 Obesity, unspecified: Secondary | ICD-10-CM

## 2013-02-16 DIAGNOSIS — R0683 Snoring: Secondary | ICD-10-CM

## 2013-02-16 DIAGNOSIS — R400 Somnolence: Secondary | ICD-10-CM | POA: Insufficient documentation

## 2013-02-16 DIAGNOSIS — G4710 Hypersomnia, unspecified: Secondary | ICD-10-CM

## 2013-02-16 DIAGNOSIS — G4733 Obstructive sleep apnea (adult) (pediatric): Secondary | ICD-10-CM | POA: Insufficient documentation

## 2013-02-16 DIAGNOSIS — R0609 Other forms of dyspnea: Secondary | ICD-10-CM

## 2013-02-16 NOTE — Assessment & Plan Note (Signed)
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

## 2013-02-16 NOTE — Assessment & Plan Note (Signed)
With likely sleep apnea Urged to get back to healthy diet and exercise- he is becoming more motivated again  Discussed how this problem influences overall health and the risks it imposes  Reviewed plan for weight loss with lower calorie diet (via better food choices and also portion control or program like weight watchers) and exercise building up to or more than 30 minutes 5 days per week including some aerobic activity

## 2013-02-16 NOTE — Progress Notes (Signed)
  Subjective:    Patient ID: Joseph Maddox, male    DOB: [birth year], 32 y.o.   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 Anxiety  . Hyperglycemia  . Quit smoking   No past medical history on file. No past surgical history on file. History  Substance Use Topics  . Smoking status: Former Smoker    Types: Cigars  . Smokeless tobacco: Not on file  . Alcohol Use: No   Family History  Problem Relation Age of Onset  . Hypertension Other     Fam hx  . Hypertension Mother   . Hypertension Father   . Stroke Maternal Grandmother   . Cancer Maternal Grandfather     prostate CA   No Known Allergies Current Outpatient Prescriptions on File Prior to Visit  Medication Sig Dispense Refill  . Multiple Vitamin (MULTIVITAMIN) tablet Take 1 tablet by mouth daily.        No current facility-administered medications on file prior to visit.     Review of Systems Review of Systems  Constitutional: Negative for fever, appetite change, and  unexpected weight change.  Eyes: Negative for pain and visual disturbance.  Respiratory: Negative for cough and shortness of breath.  pos for witnessed apena during sleep Cardiovascular: Negative for cp or palpitations    Gastrointestinal: Negative for nausea, diarrhea and constipation.  Genitourinary: Negative for urgency and frequency.  Skin: Negative for pallor or rash   Neurological: Negative for  weakness, light-headedness, numbness .  Hematological: Negative for adenopathy. Does not bruise/bleed easily.  Psychiatric/Behavioral: Negative for dysphoric mood. The patient is not nervous/anxious.         Objective:   Physical Exam  Constitutional: He appears well-developed and well-nourished. No distress.  obese and well appearing   HENT:  Head: Normocephalic and atraumatic.  Mouth/Throat: Oropharynx is clear and moist.  Eyes: Conjunctivae and EOM are normal. Pupils are equal, round, and reactive to light. No scleral icterus.  Neck: Normal range of motion. Neck supple. No JVD present. Carotid bruit is not present. No thyromegaly present.  Cardiovascular: Normal rate, regular rhythm, normal heart sounds and intact distal pulses.   Pulmonary/Chest: Effort normal and breath sounds normal. No respiratory distress. He has no wheezes. He has no rales.  Abdominal: Soft. Bowel sounds are normal.  Musculoskeletal: He exhibits no edema.  Lymphadenopathy:    He has no cervical adenopathy.  Neurological: He is alert. He has normal reflexes. No cranial nerve deficit. He exhibits normal muscle tone. Coordination normal.  Skin: Skin is warm and dry. No rash noted. No erythema. No pallor.  Psychiatric: He has a normal mood and affect.          Assessment & Plan:

## 2013-02-16 NOTE — Patient Instructions (Addendum)
We will do sleep clinic referral on the way out - see Shirlee Limerick  Work on [employer] and exercise as soon as yo can

## 2013-03-06 ENCOUNTER — Encounter: Payer: Self-pay | Admitting: Pulmonary Disease

## 2013-03-06 ENCOUNTER — Ambulatory Visit (INDEPENDENT_AMBULATORY_CARE_PROVIDER_SITE_OTHER): Payer: Commercial Indemnity | Admitting: Pulmonary Disease

## 2013-03-06 VITALS — BP 132/98 | HR 96 | Temp 98.6°F | Ht 67.0 in | Wt 253.0 lb

## 2013-03-06 DIAGNOSIS — G4733 Obstructive sleep apnea (adult) (pediatric): Secondary | ICD-10-CM

## 2013-03-06 DIAGNOSIS — R0683 Snoring: Secondary | ICD-10-CM

## 2013-03-06 DIAGNOSIS — R0609 Other forms of dyspnea: Secondary | ICD-10-CM

## 2013-03-06 NOTE — Assessment & Plan Note (Signed)
Given excessive daytime somnolence, narrow pharyngeal exam, witnessed apneas & loud snoring, obstructive sleep apnea is very likely & an overnight polysomnogram will be scheduled as a split study. The pathophysiology of obstructive sleep apnea , it's cardiovascular consequences & modes of treatment including CPAP were discused with the patient in detail & they evidenced understanding.  High pre- test prob for  obstructive sleep apnea  Home sleep study Depending on the results , you will need a CPAP titration study

## 2013-03-06 NOTE — Progress Notes (Signed)
Subjective:    Patient ID: Joseph Maddox, male    DOB: 08/15/1981, 32 y.o.   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 He needs clearance for his DOT physical He has been told that he snores extremely loud and that he stops breathing in his sleep, he is also been noted to be gasping and fighting for air. He reports excessive daytime somnolence. Epworth sleepiness score is 14/24. Bedtime is 10 PM to 1 AM, sleep latency is a few minutes, he sleeps with the TV on, he sleeps on his back on his side with 4 pillows-sleeping propped up seems to relieve his symptoms of indigestion that he has been having lately. About twice a week he will sleep upright in this couch. He is bathroom visits to out of 7 nights. He has 4-5 nocturnal awakenings without any post void sleep latency and is out of bed by 7:30 AM feeling tired with dryness of mouth. He used to play football in college and weight 175 pounds, has gained 50 pounds in the last few years There is no history suggestive of cataplexy, sleep paralysis or parasomnias He denies excessive caffeinated beverages and drinks 2 ginger ales, about 12 ounces daily  History reviewed. No pertinent past medical history.  History reviewed. No pertinent past surgical history.  No Known Allergies  History   Social History  . Marital Status: Single    Spouse Name: N/A    Number of Children: N/A  . Years of Education: N/A   Occupational History  . Not on file.   Social History Main Topics  . Smoking status: Former Smoker -- 0.75 packs/day for 6 years    Types: Cigars    Quit date: 12/11/2012  . Smokeless tobacco: Never Used  . Alcohol Use: No  . Drug Use: No  . Sexually Active: Not on file   Other Topics Concern  . Not on file   Social History Narrative  . No narrative on file    Family History  Problem Relation Age of Onset  . Hypertension Other     Fam hx  . Hypertension Mother    . Hypertension Father   . Stroke Maternal Grandmother   . Cancer Maternal Grandfather     prostate CA     Review of Systems  Constitutional: Positive for unexpected weight change. Negative for fever.  HENT: Positive for congestion. Negative for ear pain, nosebleeds, sore throat, rhinorrhea, sneezing, trouble swallowing, dental problem, postnasal drip and sinus pressure.   Eyes: Negative for redness and itching.  Respiratory: Positive for cough and shortness of breath. Negative for chest tightness and wheezing.   Cardiovascular: Negative for palpitations and leg swelling.  Gastrointestinal: Negative for nausea and vomiting.       Heartburn, Indigestion  Genitourinary: Negative for dysuria.  Musculoskeletal: Positive for back pain. Negative for joint swelling.  Skin: Negative for rash.  Neurological: Negative for headaches.  Hematological: Does not bruise/bleed easily.  Psychiatric/Behavioral: Positive for sleep disturbance. Negative for dysphoric mood. The patient is not nervous/anxious.        Objective:   Physical Exam  Gen. Pleasant, obese, in no distress, normal affect ENT - no lesions, no post nasal drip, class 2-3 airway Neck: No JVD, no thyromegaly, no carotid bruits Lungs: no use of accessory muscles, no dullness to percussion, decreased without rales or rhonchi  Cardiovascular: Rhythm regular, heart sounds  normal, no murmurs or gallops, no peripheral edema Abdomen:  soft and non-tender, no hepatosplenomegaly, BS normal. Musculoskeletal: No deformities, no cyanosis or clubbing Neuro:  alert, non focal, no tremors       Assessment & Plan:

## 2013-03-06 NOTE — Patient Instructions (Signed)
You likely have obstructive sleep apnea  Home sleep study Depending on the results , you will need a CPAP titration study

## 2013-03-08 ENCOUNTER — Telehealth: Payer: Self-pay | Admitting: Pulmonary Disease

## 2013-03-08 DIAGNOSIS — G4733 Obstructive sleep apnea (adult) (pediatric): Secondary | ICD-10-CM

## 2013-03-08 NOTE — Telephone Encounter (Signed)
His home sleep study showed severe obstructive sleep apnea - stopped breathing 79/h! Will send Rx for autoCPAP 5-20, mask of choice,download in 2 wks & arrange FU Also arrange cpap titration stucy

## 2013-03-09 NOTE — Telephone Encounter (Signed)
I spoke with patient about results and he verbalized understanding and had no questions RA has placed all orders and will forward to [facility] so they are aware.

## 2013-03-12 ENCOUNTER — Telehealth: Payer: Self-pay | Admitting: Pulmonary Disease

## 2013-03-12 NOTE — Telephone Encounter (Signed)
[name] (from same place) called re: same. She says this message has been "elevated" to her and she needs to speak to a nurse "within an hour". Call her at [phone] x [phone]. Joseph Maddox

## 2013-03-12 NOTE — Telephone Encounter (Signed)
Order was faxed to [facility] 03/09/13. I called and spoke with [place] and she stated they are awaiting approval authorization from pt's insurance. I called and made pt aware of this. He voiced his understanding and needed nothing further

## 2013-03-12 NOTE — Telephone Encounter (Signed)
I spoke with An from [place] and she wanted to know did Dr. Vassie Loll want a split night sleep study or cpap titration. I advised per orders he ordered a cpap titration to be done at [facility] sleep center. She states she will approve this and will send over approval within the hour. I will forward to [facility] so they are aware. Carron Curie, CMA

## 2013-03-13 ENCOUNTER — Telehealth: Payer: Self-pay | Admitting: Pulmonary Disease

## 2013-03-13 ENCOUNTER — Ambulatory Visit (HOSPITAL_BASED_OUTPATIENT_CLINIC_OR_DEPARTMENT_OTHER): Payer: Commercial Indemnity | Attending: Pulmonary Disease | Admitting: Radiology

## 2013-03-13 VITALS — Ht 67.0 in | Wt 235.0 lb

## 2013-03-13 DIAGNOSIS — G4733 Obstructive sleep apnea (adult) (pediatric): Secondary | ICD-10-CM | POA: Insufficient documentation

## 2013-03-13 NOTE — Telephone Encounter (Signed)
I I spoke with pt and gave him sleep center phone # as he is already scheduled for sleep study. Nothing further was needed

## 2013-03-14 ENCOUNTER — Telehealth: Payer: Self-pay | Admitting: Pulmonary Disease

## 2013-03-14 ENCOUNTER — Institutional Professional Consult (permissible substitution): Payer: Commercial Indemnity | Admitting: Pulmonary Disease

## 2013-03-14 DIAGNOSIS — G4733 Obstructive sleep apnea (adult) (pediatric): Secondary | ICD-10-CM

## 2013-03-14 NOTE — Telephone Encounter (Signed)
I spoke with pt and he statted that is the name of the mask he needs ordered. I advised pt will send order as he has an appt int he AM for this and RA is scheduled off.

## 2013-03-26 ENCOUNTER — Telehealth: Payer: Self-pay | Admitting: Pulmonary Disease

## 2013-03-26 ENCOUNTER — Encounter (HOSPITAL_BASED_OUTPATIENT_CLINIC_OR_DEPARTMENT_OTHER): Payer: Commercial Indemnity

## 2013-03-26 DIAGNOSIS — G4730 Sleep apnea, unspecified: Secondary | ICD-10-CM

## 2013-03-26 DIAGNOSIS — G4710 Hypersomnia, unspecified: Secondary | ICD-10-CM

## 2013-03-26 DIAGNOSIS — G4733 Obstructive sleep apnea (adult) (pediatric): Secondary | ICD-10-CM

## 2013-03-26 NOTE — Telephone Encounter (Signed)
Discussed with pt Sleep study showed that he had significant central apneas & will need ASV Will need another titration study with ASV -  Pl confirm this is scheduled asap

## 2013-03-27 NOTE — Procedures (Signed)
NAMENAJEE, MANNINEN               ACCOUNT NO.:  [id number]  MEDICAL RECORD NO.:  [id number]          PATIENT TYPE:  OUT  LOCATION:  SLEEP CENTER                 FACILITY:  [facility]  PHYSICIAN:  Oretha Milch, MD      DATE OF BIRTH:  08-19-1981  DATE OF STUDY:  03/13/2013                           NOCTURNAL POLYSOMNOGRAM  REFERRING PHYSICIAN:  Oretha Milch, MD  INDICATION FOR THE STUDY:  Joseph Maddox is a morbidly obese 32 year old man with severe obstructive sleep apnea.  At the time of this study, he weighed 253 pounds with a height of 5 feet 7 inches, BMI of 40, neck size of 17.5 inches, Epworth sleepiness score of 16.  This CPAP titration study was performed with a sleep technologist in attendance.  EEG, EOG, EMG, EKG, and respiratory parameters were recorded.  Sleep stages, arousals, limb movements, and respiratory data were scored according to criteria laid out by the American Academy of Sleep Medicine.  SLEEP ARCHITECTURE:  Lights out was at 10:37 p.m.  Lights on was at 5:36 a.m.  Total sleep time was 383 minutes with a sleep period of 416 minutes.  Sleep efficiency of 91% with REM latency of 98 minutes and wake after sleep onset of 33 minutes.  Sleep stages of the percentage of total sleep time was N1 7.6%, N2 64.6%, N3 1%, and REM sleep 26.6% (102 minutes).  Supine sleep accounted for 292 minutes.  REM sleep was noted in 3 stages with the longest around 5:00 a.m.  AROUSAL DATA:  There were 163 arousals with an arousal index of 26 events per hour.  Of these, 42 were spontaneous and the rest were associated with respiratory events.  RESPIRATORY DATA:  CPAP was initiated at 5 cm, but he was unable to tolerate, hence, he was switched over to BiPAP at 8/4 cm.  This was gradually increased to 25 or 18 cm due to the presence of predominantly mixed apneas, and central.  At the level of 17/12 cm for 49 minutes of sleep including 33 minutes of REM sleep, 1 central apnea and 2  mixed apnea and 1 hypopnea were noted with an AHI of 5 events per hour and a lowest desaturation of 83%.  However, once he turned on his back, mixed and central events again resurfaced at a final level of 25/18 cm for 48 minute including 45 minutes of REM sleep, 11 obstructive apneas and 5 central apneas, and 3 hypopneas were noted with a lowest desaturation of 91%.  Titration appears to be suboptimal.  LIMB MOVEMENT DATA:  No significant limb movements were noted.  OXYGEN SATURATION DATA:  He spent 55 minutes with a saturation less than 88%.  CARDIAC DATA:  The low heart rate was 36 beats per minute.  The high heart rate recorded was 101 beats per minute.  No arrhythmias were noted.  DISCUSSION:  He was desensitized with a medium full-face mask. Titration was suboptimal.  IMPRESSION: 1. Severe obstructive sleep apnea with hypopneas causing sleep     fragmentation and oxygen desaturation. 2. Suboptimal titration with final level of 25/18 cm. 3. No evidence of cardiac arrhythmias.  No evidence of limb movements,  or behavioral disturbance during sleep.  RECOMMENDATION: 1. He can be tried on bilevel with a medium full face mask but this will be sub optimal. 2. Alternatively advanced modes of ventilation such as adaptive     servoventilation (ASV) or AVAPS should be tried.He will need another         titration study on one of these modes for efficacy. 3. He should be cautioned against driving when sleepy. 4. He should be asked to avoid medications sedative side effects.     Oretha Milch, MD    RVA/MEDQ  D:  03/26/2013 12:45:36  T:  03/27/2013 86:57:84  Job:  [id number]

## 2013-03-28 NOTE — Telephone Encounter (Signed)
I spoke with pt. He is scheduled for 04/08/13 for sleep study but they are trying to work on getting him in sooner for this study. Pt had no further questions.

## 2013-03-28 NOTE — Telephone Encounter (Signed)
Pt called wanting to know why he needs another sleep study and why he needs another machine.  Please call him @ [phone]. Joseph Maddox

## 2013-04-03 ENCOUNTER — Ambulatory Visit (HOSPITAL_BASED_OUTPATIENT_CLINIC_OR_DEPARTMENT_OTHER): Payer: Commercial Indemnity | Attending: Pulmonary Disease | Admitting: Radiology

## 2013-04-03 VITALS — Ht 67.0 in | Wt 260.0 lb

## 2013-04-03 DIAGNOSIS — E669 Obesity, unspecified: Secondary | ICD-10-CM | POA: Insufficient documentation

## 2013-04-03 DIAGNOSIS — G4733 Obstructive sleep apnea (adult) (pediatric): Secondary | ICD-10-CM | POA: Insufficient documentation

## 2013-04-03 DIAGNOSIS — Z6841 Body Mass Index (BMI) 40.0 and over, adult: Secondary | ICD-10-CM | POA: Insufficient documentation

## 2013-04-06 ENCOUNTER — Telehealth: Payer: Self-pay | Admitting: Pulmonary Disease

## 2013-04-06 DIAGNOSIS — G4710 Hypersomnia, unspecified: Secondary | ICD-10-CM

## 2013-04-06 DIAGNOSIS — G4733 Obstructive sleep apnea (adult) (pediatric): Secondary | ICD-10-CM

## 2013-04-06 DIAGNOSIS — G4730 Sleep apnea, unspecified: Secondary | ICD-10-CM

## 2013-04-06 NOTE — Telephone Encounter (Signed)
I spoke with patient about results and he verbalized understanding and had no questions 

## 2013-04-06 NOTE — Telephone Encounter (Signed)
Pl let him know that study showed ASV machine works much better Rx has been sent to DME today for Resmed ASV EPAP 15cm Max PS 10 Min PS 5 Download in 2 wks

## 2013-04-07 NOTE — Procedures (Signed)
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  On a prior CPAP titration study, central apneas seem to emerge and were not corrected even with a BiPAP and his symptoms persisted even after a therapeutic trial, hence an ASV titration was undertaken.  At the time of this study, he weighed 260 pounds with a height of 5 feet 7 inches, BMI of 41.  EPWORTH SLEEPINESS SCORE:  16.  This ASV titration study was performed with a sleep technologist in attendance.  EEG, EOG, EMG, EKG, and respiratory parameters were recorded.  Sleep stages, arousals, limb movements, and respiratory data were scored according to criteria laid out by the American Academy of Sleep Medicine.  MEDICATIONS:  SLEEP ARCHITECTURE:  Lights out was at 10 p.m., lights on was at 4:38 a.m.  Total sleep time was 311 minutes with a sleep period time of 317 minutes and a sleep efficiency of 78%.  Sleep latency was 18 minutes. Latency to REM sleep was 62 minutes and wake after sleep onset was 6 minutes.  Sleep stages of the percentage of total sleep time was N1 2.6%, N2 73%, N3 0%, and REM sleep 24% (76 minutes).  Supine REM sleep accounted for 50 minutes.  RESPIRATORY DATA:  ASV was initiated at a CPAP of 5 cm, maximum pressure support of 15, and minimum pressure support of 3 with heated humidity. His EPAP was increased for snoring and respiratory events until optimal pressure was achieved at CPAP of 15 cm.  At this level for 67 minutes including 26 minutes of REM sleep, no central apneas or other the  events were noted or desaturations were noted.  The final settings EPAP of 15 cm,  maximum pressure support of 10 cm, and minimum pressure support of 5 cm.  He seemed to tolerate this well.  AROUSAL DATA:  The arousal index was 13 events per hour, mostly spontaneous.  LIMB MOVEMENT DATA:  No significant limb movements were noted.  OXYGEN DATA:  The lowest desaturation was 85%.  The desaturation index was 7 events per hour.  He spent 2 minutes with a saturation less than 88%.  CARDIAC DATA:  The low heart rate was 50 beats per minute.  The high heart rate was 104 beats per minute.  No arrhythmias were noted.  DISCUSSION:  He used his personal small nasal pillows.  He seemed to tolerate the ASV titration well, titration was optimal.   IMPRESSIONS-RECOMMENDATIONS: 1. Severe obstructive sleep apnea with central apneas emerging in     continuous positive airway pressure titration. 2. This was corrected by ASV with the settings being continuous     positive airway pressure of 15 cm, maximum pressure support of 10     cm,  and minimum for support of 5 cm with small nasal pillows. 3. No evidence of cardiac arrhythmias, limb movements, or behavioral     disturbance during sleep.  Recommend: 1. Treatment options for this degree of sleep-disordered breathing     include CPAP and weight loss therapy 2. ASV can be initiated at the above settings. 3. He should be cautioned against driving when sleepy.  He should be     asked to avoid medications sedative side effects.     Oretha Milch, MD    RVA/MEDQ  D:  04/06/2013 12:37:28  T:  04/07/2013 00:26:36  Job:  [id number]

## 2013-04-08 ENCOUNTER — Encounter (HOSPITAL_BASED_OUTPATIENT_CLINIC_OR_DEPARTMENT_OTHER): Payer: Commercial Indemnity

## 2013-04-12 ENCOUNTER — Telehealth: Payer: Self-pay | Admitting: Pulmonary Disease

## 2013-04-12 NOTE — Telephone Encounter (Signed)
According to chart new machine was ordered on 04-06-13 but pt states he has not received his machine yet and wants to know if he needs to come in for his visit on 04-16-13. I advised the pt that he will need to wait until he gets his machine and then call us for OV. I then called [employer] to check on status of order and spoke with Shanda Bumps. She states she sees the order in the system but it has not been processed and she will need to see why. She states she will speak with that dept and check to see why this has not been done yet and will call us back. Will await call back. Carron Curie, CMA

## 2013-04-12 NOTE — Telephone Encounter (Signed)
I would put off his appt until he has the machine

## 2013-04-12 NOTE — Telephone Encounter (Signed)
Left voicemail letting pt know Dr. Milinda Antis recommends r/s appt with pulmo until he gets he cpap machine

## 2013-04-12 NOTE — Telephone Encounter (Signed)
Note and will forward to RA as an [place]

## 2013-04-13 NOTE — Telephone Encounter (Signed)
Pl confirm that order is for ASV  Not bipap

## 2013-04-13 NOTE — Telephone Encounter (Signed)
Confirmed with [facility] he is being set up with ASV not bipap

## 2013-04-16 ENCOUNTER — Ambulatory Visit: Payer: Commercial Indemnity | Admitting: Pulmonary Disease

## 2013-05-14 ENCOUNTER — Telehealth: Payer: Self-pay | Admitting: Pulmonary Disease

## 2013-05-14 NOTE — Telephone Encounter (Signed)
He should have a humidifier with his machine He can turn up the setting on that

## 2013-05-14 NOTE — Telephone Encounter (Signed)
Spoke to pt. He states that for the last 6-7 nights he has woke up with a dry mouth. States that this does away as the day goes on. He is currently using a machine while he sleeps but doesn't know if it's a CPAP.  RA - please advise.

## 2013-05-14 NOTE — Telephone Encounter (Signed)
i spoke with pt and is aware of recs. Nothing further was needed

## 2013-05-17 ENCOUNTER — Ambulatory Visit (INDEPENDENT_AMBULATORY_CARE_PROVIDER_SITE_OTHER): Payer: Commercial Indemnity | Admitting: Pulmonary Disease

## 2013-05-17 ENCOUNTER — Encounter: Payer: Self-pay | Admitting: Pulmonary Disease

## 2013-05-17 VITALS — BP 134/80 | HR 90 | Temp 97.8°F | Ht 67.0 in | Wt 264.6 lb

## 2013-05-17 DIAGNOSIS — G4733 Obstructive sleep apnea (adult) (pediatric): Secondary | ICD-10-CM

## 2013-05-17 NOTE — Patient Instructions (Addendum)
You are cleared from driving standpoint CPAP is very effective - cuts down events to 2/h Weight loss encouraged, compliance with goal of at least 4-6 hrs every night is the expectation. Advised against medications with sedative side effects Cautioned against driving when sleepy - understanding that sleepiness will vary on a day to day basis - It is your responsibility to NOT drive when you are sleepy

## 2013-05-17 NOTE — Assessment & Plan Note (Signed)
Severe -79/h on home study Central apneas on CPAP/bipap Corrected by ASV-EPAP 15cm/ PSV max 10/min/5  You are cleared from driving standpoint CPAP is very effective - cuts down events to 2/h Weight loss encouraged, compliance with goal of at least 4-6 hrs every night is the expectation. Advised against medications with sedative side effects Cautioned against driving when sleepy - understanding that sleepiness will vary on a day to day basis - It is your responsibility to NOT drive when you are sleepy

## 2013-05-17 NOTE — Progress Notes (Signed)
  Subjective:    Patient ID: Joseph Maddox, male    DOB: 18-Dec-1980, 32 y.o.   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 He reports excessive daytime somnolence.  Epworth sleepiness score was 14/24.    05/17/2013  home sleep study showed severe obstructive sleep apnea - stopped breathing 79/h!  Will send Rx for autoCPAP 5-20, mask of choice showed that he had significant central apneas  study showed ASV machine works much better  Rx has been sent to DME today for  Resmed ASV EPAP 15cm  Max PS 10  Min PS 5  Download in 2 wk - reviewed , good compliance 5.5 h, no residuals, leak ok  Pt reports he is wearing his ASV x 5-8 hrs everynight. Pt states he is feeling rested during the day for the last 6-7 nights he has woke up with a dry mouth  Much improved on ASV, denies sleepiness, ESS 5/24, better rested, no naps GERD improved, nocturia resolved  Review of Systems neg for any significant sore throat, dysphagia, itching, sneezing, nasal congestion or excess/ purulent secretions, fever, chills, sweats, unintended wt loss, pleuritic or exertional cp, hempoptysis, orthopnea pnd or change in chronic leg swelling. Also denies presyncope, palpitations, heartburn, abdominal pain, nausea, vomiting, diarrhea or change in bowel or urinary habits, dysuria,hematuria, rash, arthralgias, visual complaints, headache, numbness weakness or ataxia.     Objective:   Physical Exam  Gen. Pleasant, obese, in no distress ENT - no lesions, no post nasal drip Neck: No JVD, no thyromegaly, no carotid bruits Lungs: no use of accessory muscles, no dullness to percussion, decreased without rales or rhonchi  Cardiovascular: Rhythm regular, heart sounds  normal, no murmurs  or gallops, no peripheral edema Musculoskeletal: No deformities, no cyanosis or clubbing , no tremors       Assessment & Plan:

## 2013-08-29 ENCOUNTER — Ambulatory Visit (INDEPENDENT_AMBULATORY_CARE_PROVIDER_SITE_OTHER): Payer: Commercial Indemnity

## 2013-08-29 DIAGNOSIS — Z23 Encounter for immunization: Secondary | ICD-10-CM

## 2014-02-20 ENCOUNTER — Telehealth: Payer: Self-pay | Admitting: Family Medicine

## 2014-02-20 NOTE — Telephone Encounter (Signed)
Patient Information:  Caller Name: Casimiro NeedleMichael  Phone: [phone]  Patient: Joseph Maddox, Joseph Maddox  Gender: Male  DOB: [birth year]  Age: 33 Years  PCP: Roxy Mannsower, [facility](Family Practice)  Office Follow Up:  Does the office need to follow up with this patient?: No  Instructions For The Office: N/A  RN Note:  Home care advice and call back parameters reviewed. Understanding expressed.  Symptoms  Reason For Call & Symptoms: Patient states onset of illness Monday night with headache, sneezing, runny nose + clear-yellow, sore throat, watery eyes, sinus pressure along with body aches. No thermometer. Hot flashes and occasional chills.  Eating and drinking.  He was around someone this weekend that was sick with cold/fever.  He feels like he is getting better since monday  Reviewed Health History In EMR: Yes  Reviewed Medications In EMR: Yes  Reviewed Allergies In EMR: Yes  Reviewed Surgeries / Procedures: Yes  Date of Onset of Symptoms: 02/18/2014  Treatments Tried: Tylenol Sinus, alkaseltzer cold and flu. Nyquil  Treatments Tried Worked: Yes  Guideline(s) Used:  Colds  Disposition Per Guideline:   Home Care  Reason For Disposition Reached:   Colds with no complications  Advice Given:  For a Runny Nose With Profuse Discharge:   Nasal mucus and discharge helps to wash viruses and bacteria out of the nose and sinuses.  Blowing the nose is all that is needed.  For a Stuffy Nose - Use Nasal Washes:  Introduction: Saline (salt water) nasal irrigation (nasal wash) is an effective and simple home remedy for treating stuffy nose and sinus congestion. The nose can be irrigated by pouring, spraying, or squirting salt water into the nose and then letting it run back out.  How it Helps: The salt water rinses out excess mucus, washes out any irritants (dust, allergens) that might be present, and moistens the nasal cavity.  Methods: There are several ways to perform nasal irrigation. You can use a saline nasal  spray bottle (available over-the-counter), a rubber ear syringe, a medical syringe without the needle, or a Neti Pot.  Treatment for Associated Symptoms of Colds:  For muscle aches, headaches, or moderate fever (more than 101 F or 38.9 C): Take acetaminophen every 4 hours.  Sore throat: Try throat lozenges, hard candy, or warm chicken broth.  Cough: Use cough drops.  Hydrate: Drink adequate liquids.  Humidifier:  If the air in your home is dry, use a cool-mist humidifier  Expected Course:   Fever may last 2-3 days  Nasal discharge 7-14 days  Cough up to 2-3 weeks.  Call Back If:  Difficulty breathing occurs  Fever lasts more than 3 days  Nasal discharge lasts more than 10 days  Cough lasts more than 3 weeks  You become worse  Cough Medicines:  Home Remedy - Honey: This old home remedy has been shown to help decrease coughing at night. The adult dosage is 2 teaspoons (10 ml) at bedtime. Honey should not be given to infants under one year of age.  Patient Will Follow Care Advice:  YES

## 2014-12-19 ENCOUNTER — Telehealth: Payer: Self-pay | Admitting: Pulmonary Disease

## 2014-12-19 NOTE — Telephone Encounter (Signed)
Good usage.  No residuals.

## 2014-12-19 NOTE — Telephone Encounter (Signed)
Patient notified.  Patient says that he got more nasal pillows and the CPAP is working great now. Nothing further needed.

## 2015-01-14 ENCOUNTER — Telehealth: Payer: Self-pay | Admitting: Family Medicine

## 2015-01-14 NOTE — Telephone Encounter (Signed)
I will see him then Plain claritin (not claritin D)- will probably be helpful

## 2015-01-14 NOTE — Telephone Encounter (Signed)
TELEPHONE ADVICE RECORD [facility]  Patient Name: Joseph Maddox  DOB: 08-31-1981    Initial Comment Caller says that he had dizziness and vomited 2 weeks ago, and had diarrhea after but not constant. Vomited again this last Friday and was told at clinic that it was prob viral. Was feeling better, but now feels mucus on his throat that makes him feel like he wants to vomit. Has not vomited today   Nurse Assessment  Nurse: Odis LusterBowers, RN, Bjorn Loserhonda Date/Time Lamount Cohen(Eastern Time): 01/14/2015 4:02:48 PM  Confirm and document reason for call. If symptomatic, describe symptoms. ---Caller says that he had dizziness and vomited 2 weeks ago, and had diarrhea after but not constant. Vomited again this last Friday and was told at clinic that it was prob viral. Was feeling better, but now feels mucus on his throat that makes him feel like he wants to vomit. Has not vomited today. Reports that he has no cold symptoms. Just complaining of nausea due to mucous in the back of his throat. He reports that even with water yesterday, he felt nausea. Reports that he is urinating every 6-8 hours. Denies fever.  Has the patient traveled out of the country within the last 30 days? ---No  Does the patient require triage? ---Yes  Related visit to physician within the last 2 weeks? ---Yes   [facility] on Friday and was dx with viral infection.  Does the PT have any chronic conditions? (i.e. diabetes, asthma, etc.) ---No     Guidelines    Guideline Title Affirmed Question Affirmed Notes  Nausea Nausea persists > 1 week    Final Disposition User   See PCP When Office is Open (within 3 days) Odis LusterBowers, Charity fundraiserN, Bjorn Loserhonda    Comments  Reports that he remembers the last time he had some nausea for a while and the MD recommended that he try Clariten D for post nasal drip. Caller asked if this would be ok, advised that if he is having post nasal drip, it will be fine to take since he takes nothing else, but to discuss this with the MD as  well. Caller voiced understanding.  Made caller appt with Dr. Milinda Antisower 01/15/15 @ 12:30 pm. Caller voiced understanding.

## 2015-01-15 ENCOUNTER — Ambulatory Visit (INDEPENDENT_AMBULATORY_CARE_PROVIDER_SITE_OTHER): Payer: Managed Care, Other (non HMO) | Admitting: Family Medicine

## 2015-01-15 ENCOUNTER — Encounter: Payer: Self-pay | Admitting: Family Medicine

## 2015-01-15 VITALS — BP 118/78 | HR 88 | Temp 98.7°F | Ht 65.5 in | Wt 263.0 lb

## 2015-01-15 DIAGNOSIS — R0982 Postnasal drip: Secondary | ICD-10-CM | POA: Insufficient documentation

## 2015-01-15 DIAGNOSIS — H8113 Benign paroxysmal vertigo, bilateral: Secondary | ICD-10-CM

## 2015-01-15 DIAGNOSIS — H8110 Benign paroxysmal vertigo, unspecified ear: Secondary | ICD-10-CM | POA: Insufficient documentation

## 2015-01-15 DIAGNOSIS — R112 Nausea with vomiting, unspecified: Secondary | ICD-10-CM

## 2015-01-15 MED ORDER — RANITIDINE HCL 150 MG PO CAPS
150.0000 mg | ORAL_CAPSULE | Freq: Two times a day (BID) | ORAL | Status: DC
Start: 2015-01-15 — End: 2015-02-08

## 2015-01-15 NOTE — Progress Notes (Signed)
Pre visit review using our clinic review tool, if applicable. No additional management support is needed unless otherwise documented below in the visit note. 

## 2015-01-15 NOTE — Patient Instructions (Signed)
I think you have had a viral syndrome with some vertigo and also post nasal drip and indigestion  Get over the counter zyrtec 10 mg daily  (take at night - store brand is fine)-take it for about 2 weeks to help the drip  Also take zantac (ranitidine)- I sent to pharmacy-this helps acid production in the stomach and acid reflux (which can cause a feeling of mucous in throat and also vomiting) - take for 2 weeks  Keep hydrated  If symptoms suddenly worsen-let me know If no improved after 2 weeks let me know

## 2015-01-15 NOTE — Progress Notes (Signed)
Subjective:    Patient ID: Joseph Maddox, male    DOB: [birth year], 34 y.o.   MRN: [id number]  HPI Here with post nasal drainage/uri symptoms and GI symptoms  2 weeks ago he started vomiting - for "no reason" Got better The week after -dizzy/lightheaded and occ diarrhea  This past Friday - was also dizzy and then vomited again at work [employer] to urgent care -did exam and lab on him - said it may be a virus   He rested this weekend-he was dizzy and nauseated Went to work yesterday  Also went to the gym - felt nauseated there -- thinks that post nasal drip may be part of the problem  He is back to eating normally-that does not bother him  Drinking water   Patient Active Problem List   Diagnosis Date Noted  . OSA (obstructive sleep apnea) 02/16/2013  . Somnolence, daytime 02/16/2013  . Obesity, unspecified 02/16/2013  . Quit smoking 03/01/2012  . Hyperglycemia 07/20/2011  . Anxiety 07/06/2011   No past medical history on file. No past surgical history on file. History  Substance Use Topics  . Smoking status: Former Smoker -- 0.75 packs/day for 6 years    Types: Cigars    Quit date: 12/11/2012  . Smokeless tobacco: Never Used  . Alcohol Use: 0.0 oz/week    0 Standard drinks or equivalent per week     Comment: occassionally   Family History  Problem Relation Age of Onset  . Hypertension Other     Fam hx  . Hypertension Mother   . Hypertension Father   . Stroke Maternal Grandmother   . Cancer Maternal Grandfather     prostate CA   No Known Allergies No current outpatient prescriptions on file prior to visit.   No current facility-administered medications on file prior to visit.     Review of Systems Review of Systems  Constitutional: Negative for fever, appetite change, fatigue and unexpected weight change.  ENT pos for post nasal drip and rhinorrhea, neg for facial pain  Eyes: Negative for pain and visual disturbance.  Respiratory: Negative for cough and  shortness of breath.   Cardiovascular: Negative for cp or palpitations    Gastrointestinal: Negative for  diarrhea and constipation. neg for abd pain or blood in stool  Genitourinary: Negative for urgency and frequency.  Skin: Negative for pallor or rash   Neurological: Negative for weakness, numbness and headaches. pos for dizziness that is now resolved  Hematological: Negative for adenopathy. Does not bruise/bleed easily.  Psychiatric/Behavioral: Negative for dysphoric mood. The patient is not nervous/anxious.         Objective:   Physical Exam  Constitutional: He appears well-developed and well-nourished. No distress.  obese and well appearing   HENT:  Head: Normocephalic and atraumatic.  Right Ear: External ear normal.  Left Ear: External ear normal.  Mouth/Throat: Oropharynx is clear and moist. No oropharyngeal exudate.  Nares are boggy  slt clear rhinorrhea   Eyes: Conjunctivae and EOM are normal. Pupils are equal, round, and reactive to light. Right eye exhibits no discharge. Left eye exhibits no discharge. No scleral icterus.  Neck: Normal range of motion. Neck supple.  Cardiovascular: Normal rate and regular rhythm.   Pulmonary/Chest: Effort normal and breath sounds normal. No respiratory distress. He has no wheezes. He has no rales.  Abdominal: Soft. Bowel sounds are normal. He exhibits no distension and no mass. There is no tenderness. There is no rebound and no guarding.  Musculoskeletal: He exhibits no edema.  Lymphadenopathy:    He has no cervical adenopathy.  Neurological: He is alert.  Skin: Skin is warm and dry. No rash noted. No erythema. No pallor.  Psychiatric: He has a normal mood and affect.          Assessment & Plan:   Problem List Items Addressed This Visit      Digestive   Nausea with vomiting - Primary    Suspect this is related to recent viral syndrome which also caused vertigo and post nasal drip  Will tx with antihistamine - zyrtec  Also H2  blocker for stomach acid   Update if not starting to improve in a week or if worsening          Nervous and Auditory   Benign paroxysmal positional vertigo    Poss viral vestibulitis Now better Still some nausea and post nasal drip  Will update if dizziness returns         Other   Post-nasal drip    Causing nausea  Trial of zyrtec 10 mg otc daily Update if not starting to improve in a week or if worsening

## 2015-01-16 NOTE — Assessment & Plan Note (Signed)
Causing nausea  Trial of zyrtec 10 mg otc daily Update if not starting to improve in a week or if worsening

## 2015-01-16 NOTE — Assessment & Plan Note (Signed)
Poss viral vestibulitis Now better Still some nausea and post nasal drip  Will update if dizziness returns

## 2015-01-16 NOTE — Assessment & Plan Note (Signed)
Suspect this is related to recent viral syndrome which also caused vertigo and post nasal drip  Will tx with antihistamine - zyrtec  Also H2 blocker for stomach acid   Update if not starting to improve in a week or if worsening

## 2015-01-20 ENCOUNTER — Telehealth: Payer: Self-pay

## 2015-01-20 NOTE — Telephone Encounter (Signed)
PLEASE NOTE: All timestamps contained within this report are represented as [place] Standard Time. CONFIDENTIALTY NOTICE: This fax transmission is intended only for the addressee. It contains information that is legally privileged, confidential or otherwise protected from use or disclosure. If you are not the intended recipient, you are strictly prohibited from reviewing, disclosing, copying using or disseminating any of this information or taking any action in reliance on or regarding this information. If you have received this fax in error, please notify us immediately by telephone so that we can arrange for its return to us. Phone: [phone], Toll-Free: [phone], Fax: [phone] Page: 1 of 1 Call Id: [id number] [facility] Client TELEPHONE ADVICE RECORD [facility] Patient Name: Joseph Maddox Gender: Male DOB: April 08, 1981 Age: [age] Y 8 M 27 D Return Phone Number: [phone] (Primary) Address: [address] [place]Zip: [place] [id number] Client [facility] Physician [place], [place] Contact Type Call Call Type Triage / Clinical Relationship To Patient Self Return Phone Number [phone] (Primary) Chief Complaint Medication Question (non symptomatic) Initial Comment Caller said has question about his medicine. He needs to know if he can drink Alcohol with his meds? Nurse Assessment Nurse: Lucretia FieldGrossman, RN, Santina Evansatherine Date/Time (Eastern Time): 01/17/2015 6:54:01 PM Confirm and document reason for call. If symptomatic, describe symptoms. ---Caller said has question about his medicine. He needs to know if he can drink Alcohol with his meds? Zyrtek, Zantac. Checked on medscape for drug interactions- none reported. Has the patient traveled out of the country within the last 30 days? ---Not Applicable Does the patient require triage?  ---No Guidelines Guideline Title Affirmed Question Affirmed Notes Nurse Date/Time (Eastern Time) Disp. Time Lamount Cohen(Eastern Time) Disposition Final User 01/17/2015 6:58:30 PM Clinical Call Yes Lucretia FieldGrossman, RN, Santina Evansatherine After Care Instructions Given Call Event Type User Date / Time Description

## 2015-01-20 NOTE — Telephone Encounter (Signed)
[place]

## 2015-02-03 ENCOUNTER — Emergency Department: Payer: Self-pay | Admitting: Emergency Medicine

## 2015-02-03 LAB — URINALYSIS, COMPLETE
Bacteria: NONE SEEN
Bilirubin,UR: NEGATIVE
Blood: NEGATIVE
Glucose,UR: NEGATIVE mg/dL (ref 0–75)
Ketone: NEGATIVE
Leukocyte Esterase: NEGATIVE
Nitrite: NEGATIVE
PH: 6 (ref 4.5–8.0)
Protein: NEGATIVE
RBC, UR: NONE SEEN /HPF (ref 0–5)
SQUAMOUS EPITHELIAL: NONE SEEN
Specific Gravity: 1.002 (ref 1.003–1.030)
WBC UR: NONE SEEN /HPF (ref 0–5)

## 2015-02-03 LAB — BASIC METABOLIC PANEL
Anion Gap: 6 — ABNORMAL LOW (ref 7–16)
BUN: 15 mg/dL
CHLORIDE: 104 mmol/L
CREATININE: 1.13 mg/dL
Calcium, Total: 9.9 mg/dL
Co2: 29 mmol/L
EGFR (Non-African Amer.): >60
Glucose: 104 mg/dL — ABNORMAL HIGH
Potassium: 3.8 mmol/L
Sodium: 139 mmol/L

## 2015-02-03 LAB — CBC WITH DIFFERENTIAL/PLATELET
BASOS PCT: 0.7 %
Basophil #: 0.1 10*3/uL (ref 0.0–0.1)
EOS PCT: 0.7 %
Eosinophil #: 0.1 10*3/uL (ref 0.0–0.7)
HCT: 47.3 % (ref 40.0–52.0)
HGB: 15.5 g/dL (ref 13.0–18.0)
LYMPHS PCT: 23.2 %
Lymphocyte #: 2.5 10*3/uL (ref 1.0–3.6)
MCH: 28.1 pg (ref 26.0–34.0)
MCHC: 32.7 g/dL (ref 32.0–36.0)
MCV: 86 fL (ref 80–100)
MONOS PCT: 8.5 %
Monocyte #: 0.9 x10 3/mm (ref 0.2–1.0)
NEUTROS PCT: 66.9 %
Neutrophil #: 7.1 10*3/uL — ABNORMAL HIGH (ref 1.4–6.5)
Platelet: 275 10*3/uL (ref 150–440)
RBC: 5.5 10*6/uL (ref 4.40–5.90)
RDW: 13.5 % (ref 11.5–14.5)
WBC: 10.6 10*3/uL (ref 3.8–10.6)

## 2015-02-03 LAB — TROPONIN I

## 2015-02-04 ENCOUNTER — Telehealth: Payer: Self-pay | Admitting: Family Medicine

## 2015-02-04 LAB — CK: CK, Total: 420 U/L — ABNORMAL HIGH

## 2015-02-04 NOTE — Telephone Encounter (Signed)
[facility] scheduled appt 02/05/15 at 9 AM with Dr Dayton MartesAron.

## 2015-02-04 NOTE — Telephone Encounter (Signed)
Patient Name: Joseph MaudlinMICHAEL Maddox DOB: 1981/09/05 Initial Comment Caller states was in the ER yesterday for a reaction to his food intake. Today he has slight dizziness Nurse Assessment Nurse: Charna Elizabethrumbull, RN, Lynden Angathy Date/Time (Eastern Time): 02/04/2015 12:16:50 PM Confirm and document reason for call. If symptomatic, describe symptoms. ---Caller states he has had dizziness on and off the past 2-3 weeks and it was worse yesterday. No injury in the past 3 days. No fever. No severe breathing difficulty. Has the patient traveled out of the country within the last 30 days? ---No Does the patient require triage? ---Yes Related visit to physician within the last 2 weeks? ---Yes Does the PT have any chronic conditions? (i.e. diabetes, asthma, etc.) ---Yes List chronic conditions. ---Sleep Apnea, Dizziness Guidelines Guideline Title Affirmed Question Affirmed Notes Dizziness - Lightheadedness [1] MODERATE dizziness (e.g., interferes with normal activities) AND [2] has been evaluated by physician for this Final Disposition User See Physician within 24 Hours Duryearumbull, RN, Lynden Angathy Comments Appointment scheduled with Dr. Dayton MartesAron 02/05/15 at 9am.

## 2015-02-05 ENCOUNTER — Ambulatory Visit (INDEPENDENT_AMBULATORY_CARE_PROVIDER_SITE_OTHER): Payer: Managed Care, Other (non HMO) | Admitting: Family Medicine

## 2015-02-05 ENCOUNTER — Encounter: Payer: Self-pay | Admitting: Family Medicine

## 2015-02-05 VITALS — BP 124/76 | HR 88 | Temp 98.1°F | Wt 261.5 lb

## 2015-02-05 DIAGNOSIS — R251 Tremor, unspecified: Secondary | ICD-10-CM | POA: Insufficient documentation

## 2015-02-05 DIAGNOSIS — R112 Nausea with vomiting, unspecified: Secondary | ICD-10-CM

## 2015-02-05 DIAGNOSIS — H8113 Benign paroxysmal vertigo, bilateral: Secondary | ICD-10-CM | POA: Diagnosis not present

## 2015-02-05 LAB — COMPREHENSIVE METABOLIC PANEL
ALT: 33 U/L (ref 0–53)
AST: 21 U/L (ref 0–37)
Albumin: 4.8 g/dL (ref 3.5–5.2)
Alkaline Phosphatase: 59 U/L (ref 39–117)
BUN: 12 mg/dL (ref 6–23)
CALCIUM: 10.5 mg/dL (ref 8.4–10.5)
CHLORIDE: 99 meq/L (ref 96–112)
CO2: 32 meq/L (ref 19–32)
CREATININE: 1.1 mg/dL (ref 0.40–1.50)
GFR: 98.08 mL/min (ref >60.00)
Glucose, Bld: 110 mg/dL — ABNORMAL HIGH (ref 70–99)
Potassium: 4 mEq/L (ref 3.5–5.1)
Sodium: 134 mEq/L — ABNORMAL LOW (ref 135–145)
TOTAL PROTEIN: 8.4 g/dL — AB (ref 6.0–8.3)
Total Bilirubin: 0.6 mg/dL (ref 0.2–1.2)

## 2015-02-05 LAB — CBC WITH DIFFERENTIAL/PLATELET
BASOS PCT: 0.4 % (ref 0.0–3.0)
Basophils Absolute: 0 10*3/uL (ref 0.0–0.1)
EOS ABS: 0.1 10*3/uL (ref 0.0–0.7)
EOS PCT: 0.7 % (ref 0.0–5.0)
HEMATOCRIT: 48.6 % (ref 39.0–52.0)
HEMOGLOBIN: 16.6 g/dL (ref 13.0–17.0)
LYMPHS PCT: 25.2 % (ref 12.0–46.0)
Lymphs Abs: 2 10*3/uL (ref 0.7–4.0)
MCHC: 34.1 g/dL (ref 30.0–36.0)
MCV: 84.2 fl (ref 78.0–100.0)
MONO ABS: 0.7 10*3/uL (ref 0.1–1.0)
Monocytes Relative: 8.4 % (ref 3.0–12.0)
Neutro Abs: 5.1 10*3/uL (ref 1.4–7.7)
Neutrophils Relative %: 65.3 % (ref 43.0–77.0)
Platelets: 304 10*3/uL (ref 150.0–400.0)
RBC: 5.77 Mil/uL (ref 4.22–5.81)
RDW: 13.1 % (ref 11.5–15.5)
WBC: 7.8 10*3/uL (ref 4.0–10.5)

## 2015-02-05 LAB — VITAMIN B12: Vitamin B-12: 544 pg/mL (ref 211–911)

## 2015-02-05 LAB — TSH: TSH: 1.23 u[IU]/mL (ref 0.35–4.50)

## 2015-02-05 LAB — HEMOGLOBIN A1C: Hgb A1c MFr Bld: 6.1 % (ref 4.6–6.5)

## 2015-02-05 NOTE — Patient Instructions (Signed)
It was nice to meet you. We will call you with your lab results from today. Please continue to eat frequent small meals throughout the day--maybe even a snack before bedtime that has carb/protein (like peanut butter crackers).

## 2015-02-05 NOTE — Progress Notes (Signed)
Subjective:   Patient ID: Joseph Maddox, male    DOB: 1981/03/24, 34 y.o.   MRN: [id number]  Joseph Maddox is a pleasant 34 y.o. year old male pt of Dr. Milinda Antis with h/o OSA, new to me, who presents to clinic today with Hospitalization Follow-up  on 02/05/2015  HPI:  Saw his PCP, Dr. Milinda Antis, for this on 01/15/15- note reviewed. She suspected that URI symptoms lead to vertigo.  Advised antihistamine and H2 blocker- took Zyrtec and Zantac which he took for two weeks- they did help a little with post nasal drip.  He then went to [facility] ER for persistent symptoms two days ago.  Started intermittent fasting three days prior to that- fasting for 16 hours and then "load up" on food and repeat the cycle.  The day he went to the ER, continued this fast and went weight lifting. He got home that night to eat and "load up." Started to feel nervous and shaky with blurred vision, dizziness and weakness.  Room was not spinning and he was not nauseated.  Heart was racing.  We are still awaiting ER records- per pt, blood worked showed he was not a diabetic. EKG was normal.  ER doctor told him that he was probably hypoglycemic since by the time the got to the ER, his FSBS was only 104.  Advised to eat regular, small meals.  Yesterday, started to feel shaky prior to meals.  After he ate, he felt very sleepy.  No known FH of diabetes.  He felt he has been "extra thirsty."  Current Outpatient Prescriptions on File Prior to Visit  Medication Sig Dispense Refill  . Nutritional Supplements (NUTRITIONAL SHAKE PLUS PROTEIN PO) Take by mouth as needed.    . Probiotic Product (PROBIOTIC DAILY PO) Take by mouth daily.    . ranitidine (ZANTAC) 150 MG capsule Take 1 capsule (150 mg total) by mouth 2 (two) times daily. 60 capsule 0   No current facility-administered medications on file prior to visit.    No Known Allergies  History reviewed. No pertinent past medical history.  History reviewed. No pertinent past  surgical history.  Family History  Problem Relation Age of Onset  . Hypertension Other     Fam hx  . Hypertension Mother   . Hypertension Father   . Stroke Maternal Grandmother   . Cancer Maternal Grandfather     prostate CA    History   Social History  . Marital Status: Single    Spouse Name: N/A  . Number of Children: N/A  . Years of Education: N/A   Occupational History  . Not on file.   Social History Main Topics  . Smoking status: Former Smoker -- 0.75 packs/day for 6 years    Types: Cigars    Quit date: 12/11/2012  . Smokeless tobacco: Never Used  . Alcohol Use: 0.0 oz/week    0 Standard drinks or equivalent per week     Comment: occassionally  . Drug Use: No  . Sexual Activity: Not on file   Other Topics Concern  . Not on file   Social History Narrative   The PMH, PSH, Social History, Family History, Medications, and allergies have been reviewed in [facility], and have been updated if relevant.     Review of Systems  Constitutional: Positive for fatigue.  HENT: Negative for trouble swallowing.   Respiratory: Negative.   Cardiovascular: Negative.   Gastrointestinal: Positive for nausea.  Endocrine: Positive for cold intolerance.  Musculoskeletal:  Negative.   Skin: Negative.   Neurological: Positive for dizziness and weakness. Negative for tremors, facial asymmetry, speech difficulty and headaches.  Hematological: Negative.   Psychiatric/Behavioral: The patient is nervous/anxious.        Objective:    BP 124/76 mmHg  Pulse 88  Temp(Src) 98.1 F (36.7 C) (Oral)  Wt 261 lb 8 oz (118.616 kg)  SpO2 98%   Physical Exam  Constitutional: He is oriented to person, place, and time. He appears well-developed and well-nourished. No distress.  obese  HENT:  Head: Normocephalic.  Eyes: Conjunctivae are normal.  Neck: Normal range of motion.  Cardiovascular: Normal rate and regular rhythm.   Pulmonary/Chest: Effort normal and breath sounds normal. No  respiratory distress. He has no wheezes. He has no rales. He exhibits no tenderness.  Musculoskeletal: Normal range of motion. He exhibits no edema.  Neurological: He is alert and oriented to person, place, and time. No cranial nerve deficit.  Skin: Skin is warm and dry.  Psychiatric: He has a normal mood and affect. His behavior is normal. Judgment and thought content normal.  Nursing note and vitals reviewed.         Assessment & Plan:   Non-intractable vomiting with nausea, vomiting of unspecified type  Benign paroxysmal positional vertigo, bilateral No Follow-up on file.

## 2015-02-05 NOTE — Progress Notes (Signed)
Pre visit review using our clinic review tool, if applicable. No additional management support is needed unless otherwise documented below in the visit note. 

## 2015-02-05 NOTE — Assessment & Plan Note (Signed)
  With multiple other associated symptoms - I advised against him fasting like he had been doing and to eat multiple small meals per day, including a snack before bed.. Awaiting hospital records but we certainly should do some lab work today- including a1c, TSH, B12, CBC and CMET as initial work up. Vertigo and nausea symptoms have resolved.  URI symptoms have also resolved. Follow up with PCP next week. The patient indicates understanding of these issues and agrees with the plan.  Orders Placed This Encounter  Procedures  . CBC with Differential/Platelet  . TSH  . Hemoglobin A1c  . Comprehensive metabolic panel  . Vitamin B12

## 2015-02-08 ENCOUNTER — Other Ambulatory Visit: Payer: Self-pay | Admitting: Family Medicine

## 2015-02-12 ENCOUNTER — Telehealth: Payer: Self-pay

## 2015-02-12 NOTE — Telephone Encounter (Signed)
pt left v/m; [facility] and was seen at ED 02/03/15. When pt was in ED was advised that lifting heavy weights might cause blood sugar to drop. Pt wants to know Dr Royden Purlower's opinion if lifting heavy weights can cause BS to drop and if so when will it be safe for pt to restart working out and lifting heavy weights. Pt request cb.

## 2015-02-12 NOTE — Telephone Encounter (Signed)
Any form of exercise can -so make sure to eat frequent small meals with both protein and carbohydrates (complex carbs with fiber are best) - especially if he knows he is going to work out

## 2015-02-12 NOTE — Telephone Encounter (Signed)
Patient notified, will eat small meals throughout the day to keep BS level up especially when exercising

## 2018-01-13 ENCOUNTER — Ambulatory Visit: Payer: BLUE CROSS/BLUE SHIELD | Admitting: Family Medicine

## 2018-01-13 ENCOUNTER — Ambulatory Visit: Payer: Self-pay | Admitting: Family Medicine

## 2018-01-13 ENCOUNTER — Encounter: Payer: Self-pay | Admitting: Family Medicine

## 2018-01-13 VITALS — BP 122/78 | HR 70 | Temp 98.4°F | Ht 65.5 in | Wt 267.2 lb

## 2018-01-13 DIAGNOSIS — R739 Hyperglycemia, unspecified: Secondary | ICD-10-CM | POA: Diagnosis not present

## 2018-01-13 DIAGNOSIS — R0981 Nasal congestion: Secondary | ICD-10-CM

## 2018-01-13 DIAGNOSIS — Z23 Encounter for immunization: Secondary | ICD-10-CM | POA: Diagnosis not present

## 2018-01-13 DIAGNOSIS — G4733 Obstructive sleep apnea (adult) (pediatric): Secondary | ICD-10-CM

## 2018-01-13 MED ORDER — FLUTICASONE PROPIONATE 50 MCG/ACT NA SUSP: 2.0000 | Freq: Every day | NASAL | 11 refills | Status: DC

## 2018-01-13 NOTE — Progress Notes (Signed)
Subjective:    Patient ID: Joseph Maddox, male    DOB: 12/06/1980, 37 y.o.   MRN: [id number]  HPI Here for nasal congestion  That is not unusual for him  Still takes zyrtec nightly   Uses afrin - off and on for about a year  He uses it about 3-4 times a week Has not used any steroid nasal sprays   Nasal saline helps a bit   Some runny nose on and off with sneezing  No pain in sinuses  No fever  Nasal mucous is sometimes colored/usually clear  No coughing    He is worse when the seasons change  No known allergies   Wt Readings from Last 3 Encounters:  01/13/18 267 lb 4 oz (121.2 kg)  02/05/15 261 lb 8 oz (118.6 kg)  01/15/15 263 lb (119.3 kg)   43.80 kg/m   BP Readings from Last 3 Encounters:  01/13/18 122/78  02/05/15 124/76  01/15/15 118/78    Flu shot status -has not had one   Has been doing pretty well overall  Using cpap machine    No other care   Hx of hyperglycemia He lost 40 lb , then gained 20 lb back over the summer  Got up to 290  Watching carbs and eating times  He is exercising - uses a HIIT app - it is helping   Lab Results  Component Value Date   HGBA1C 6.1 02/05/2015   Lab Results  Component Value Date   CHOL 157 05/25/2012   HDL 46.90 05/25/2012   LDLCALC 80 05/25/2012   TRIG 152.0 (H) 05/25/2012   CHOLHDL 3 05/25/2012   ? Last tetanus shot   Has cold feet   Patient Active Problem List   Diagnosis Date Noted  . Nasal congestion 01/13/2018  . Shakiness 02/05/2015  . Benign paroxysmal positional vertigo 01/15/2015  . Post-nasal drip 01/15/2015  . OSA (obstructive sleep apnea) 02/16/2013  . Somnolence, daytime 02/16/2013  . Obesity, unspecified 02/16/2013  . Quit smoking 03/01/2012  . Hyperglycemia 07/20/2011  . Anxiety 07/06/2011   History reviewed. No pertinent past medical history. History reviewed. No pertinent surgical history. Social History   Tobacco Use  . Smoking status: Former Smoker    Packs/day: 0.75    Years: 6.00    Pack years: 4.50    Types: Cigars    Last attempt to quit: 12/11/2012    Years since quitting: 5.0  . Smokeless tobacco: Never Used  Substance Use Topics  . Alcohol use: Yes    Alcohol/week: 0.0 oz    Comment: occassionally  . Drug use: No   Family History  Problem Relation Age of Onset  . Hypertension Other        Fam hx  . Hypertension Mother   . Hypertension Father   . Stroke Maternal Grandmother   . Cancer Maternal Grandfather        prostate CA   No Known Allergies Current Outpatient Medications on File Prior to Visit  Medication Sig Dispense Refill  . cetirizine (ZYRTEC) 10 MG tablet Take 10 mg by mouth daily.     No current facility-administered medications on file prior to visit.     Review of Systems  Constitutional: Negative for activity change, appetite change, fatigue, fever and unexpected weight change.  HENT: Positive for postnasal drip, rhinorrhea and sneezing. Negative for congestion, ear pain, nosebleeds, sinus pressure, sinus pain, sore throat and trouble swallowing.   Eyes: Negative for pain, redness,  itching and visual disturbance.  Respiratory: Negative for cough, chest tightness, shortness of breath and wheezing.   Cardiovascular: Negative for chest pain and palpitations.  Gastrointestinal: Negative for abdominal pain, blood in stool, constipation, diarrhea and nausea.  Endocrine: Negative for cold intolerance, heat intolerance, polydipsia and polyuria.  Genitourinary: Negative for difficulty urinating, dysuria, frequency and urgency.  Musculoskeletal: Negative for arthralgias, joint swelling and myalgias.  Skin: Negative for pallor and rash.  Neurological: Negative for dizziness, tremors, weakness, numbness and headaches.  Hematological: Negative for adenopathy. Does not bruise/bleed easily.  Psychiatric/Behavioral: Negative for decreased concentration and dysphoric mood. The patient is not nervous/anxious.        Objective:    Physical Exam  Constitutional: He appears well-developed and well-nourished. No distress.  obese and well appearing   HENT:  Head: Normocephalic and atraumatic.  Right Ear: External ear normal.  Left Ear: External ear normal.  Mouth/Throat: Oropharynx is clear and moist.  Nares are injected and congested  Clear rhinorrhea and pnd No sinus tenderness  Eyes: Conjunctivae and EOM are normal. Pupils are equal, round, and reactive to light. Right eye exhibits no discharge. Left eye exhibits no discharge. No scleral icterus.  Neck: Normal range of motion. Neck supple.  Cardiovascular: Normal rate, regular rhythm and normal heart sounds.  Pulmonary/Chest: Effort normal and breath sounds normal. No respiratory distress. He has no wheezes. He has no rales.  Lymphadenopathy:    He has no cervical adenopathy.  Neurological: He is alert. No cranial nerve deficit. He exhibits normal muscle tone. Coordination normal.  Skin: Skin is warm and dry. No rash noted. No pallor.  Psychiatric: He has a normal mood and affect.          Assessment & Plan:   Problem List Items Addressed This Visit      Respiratory   OSA (obstructive sleep apnea)    Continues to do well with cpap        Other   Hyperglycemia    Lab Results  Component Value Date   HGBA1C 6.1 02/05/2015   Currently working on diet  disc imp of low glycemic diet and wt loss to prevent DM2  F/u planned for A1C and health mt  Enc further wt loss       Nasal congestion - Primary    Chronic-suspect due to allergic rhinitis  Adv to stop afrin as he is likely getting rebound congestion  Start flonase ns qd Continue antihistamine prn otc  Also nasal saline or netti pot as needed  .udpate -esp if any sinus pain develops       Other Visit Diagnoses    Need for influenza vaccination       Relevant Orders   Flu Vaccine QUAD 6+ mos PF IM (Fluarix Quad PF) (Completed)   Need for Tdap vaccination       Relevant Orders   Tdap  vaccine greater than or equal to 7yo IM (Completed)

## 2018-01-13 NOTE — Patient Instructions (Addendum)
You can use nasal saline as often as you want   Stop the afrin and throw it away - you will be more congested for the first week  Continue the zyrtec if it helps  Start flonase nasal spray every day   Flu shot today  Tetanus shot today    Follow up in 3-6 months for a physical with labs prior

## 2018-01-15 NOTE — Assessment & Plan Note (Signed)
Lab Results  Component Value Date   HGBA1C 6.1 02/05/2015   Currently working on diet  disc imp of low glycemic diet and wt loss to prevent DM2  F/u planned for A1C and health mt  Enc further wt loss

## 2018-01-15 NOTE — Assessment & Plan Note (Signed)
Continues to do well with cpap

## 2018-01-15 NOTE — Assessment & Plan Note (Signed)
Discussed how this problem influences overall health and the risks it imposes  Reviewed plan for weight loss with lower calorie diet (via better food choices and also portion control or program like weight watchers) and exercise building up to or more than 30 minutes 5 days per week including some aerobic activity    

## 2018-01-15 NOTE — Assessment & Plan Note (Signed)
Chronic-suspect due to allergic rhinitis  Adv to stop afrin as he is likely getting rebound congestion  Start flonase ns qd Continue antihistamine prn otc  Also nasal saline or netti pot as needed  .udpate -esp if any sinus pain develops

## 2018-02-27 ENCOUNTER — Ambulatory Visit (INDEPENDENT_AMBULATORY_CARE_PROVIDER_SITE_OTHER): Payer: BLUE CROSS/BLUE SHIELD | Admitting: Pulmonary Disease

## 2018-02-27 ENCOUNTER — Encounter: Payer: Self-pay | Admitting: Pulmonary Disease

## 2018-02-27 VITALS — BP 128/92 | HR 64 | Ht 67.0 in | Wt 265.0 lb

## 2018-02-27 DIAGNOSIS — G4730 Sleep apnea, unspecified: Secondary | ICD-10-CM

## 2018-02-27 DIAGNOSIS — Z6841 Body Mass Index (BMI) 40.0 and over, adult: Secondary | ICD-10-CM

## 2018-02-27 NOTE — Progress Notes (Signed)
[facility]  Chief Complaint  Patient presents with  . Sleep Apnea    CPAP     Vital signs: BP (!) 128/92 (BP Location: Left Arm, Cuff Size: Normal)   Pulse 64   Ht 5\' 7"  (1.702 m)   Wt 265 lb (120.2 kg)   SpO2 99%   BMI 41.50 kg/m   History of Present Illness: Joseph Maddox is a 37 y.o. male with obstructive sleep apnea.  He was seen previously by Dr. Vassie LollAlva.  He was found to have severe sleep apnea with both obstructive and central events.  He was eventually set up with an ASV.  He uses nasal mask.  His machine is more than 38 yrs old, and he was advised by his DME that he could get a new machine.  He goes to sleep at 11 pm.  He falls asleep after few minutes.  He wakes up some times to use the bathroom.  He gets out of bed between 4 and 8 am.  He feels okay in the morning.  He denies morning headache.  He does not use anything to help him fall sleep or stay awake.  He denies sleep walking, sleep talking, bruxism, or nightmares.  There is no history of restless legs.  He denies sleep hallucinations, sleep paralysis, or cataplexy.  The Epworth score is 5 out of 24.  Physical Exam:  General - pleasant Eyes - pupils reactive, wears glasses ENT - no sinus tenderness, no oral exudate, no LAN, MP 3 Cardiac - regular, no murmur Chest - no wheeze, rales Abd - soft, non tender Ext - no edema Skin - no rashes Neuro - normal strength Psych - normal mood  Discussion: He has history of sleep apnea and has been on ASV.  He is due for a new machine.    Assessment/Plan:  Sleep apnea. - previous study showed both obstructive and central apnea - will arrange for new auto Bipap with max IPAP 25, min EPAP 15, PS 5 - explained he might need repeat in lab titration study  Obesity. - discussed options to assist with weight loss   Patient Instructions  Will arrange for new Bipap/ASV machine  Follow up in 2 months    Coralyn HellingVineet Daman Steffenhagen,  MD [facility] 02/27/2018, 10:07 AM  Flow Sheet  Sleep tests: HST 03/06/13 >> AHI 79, SaO2 low 59% Auto Bipap 11/29/17 to 02/26/18 >> >> used on 89 of 90 nights with average 5 hrs 11 min.  Average AHI 1.6 with EPAP 15, Median IPAP 21 cm H2O  Review of Systems: 12 point ROS negative except in HPI.  Past Medical History: He  has a past medical history of Allergic rhinitis and Sleep apnea.  Past Surgical History: He has not prior surgeries.  Family History: His family history includes Cancer in his maternal grandfather; Hypertension in his father, mother, and other; Stroke in his maternal grandmother.  Social History: He  reports that he quit smoking about 5 years ago. His smoking use included cigars. He has a 4.50 pack-year smoking history. He has never used smokeless tobacco. He reports that he drinks alcohol. He reports that he does not use drugs.  Medications: Allergies as of 02/27/2018   No Known Allergies     Medication List        Accurate as of 02/27/18 10:07 AM. Always use your most recent med list.          cetirizine 10 MG  tablet Commonly known as:  ZYRTEC Take 10 mg by mouth daily.   fluticasone 50 MCG/ACT nasal spray Commonly known as:  FLONASE Place 2 sprays into both nostrils daily.

## 2018-02-27 NOTE — Patient Instructions (Signed)
Will arrange for new Bipap/ASV machine  Follow up in 2 months

## 2018-04-25 ENCOUNTER — Encounter: Payer: BLUE CROSS/BLUE SHIELD | Admitting: Family Medicine

## 2018-04-28 ENCOUNTER — Ambulatory Visit: Payer: BLUE CROSS/BLUE SHIELD | Admitting: Pulmonary Disease

## 2018-05-09 ENCOUNTER — Telehealth: Payer: Self-pay | Admitting: Family Medicine

## 2018-05-09 DIAGNOSIS — Z0000 Encounter for general adult medical examination without abnormal findings: Secondary | ICD-10-CM

## 2018-05-09 DIAGNOSIS — Z0001 Encounter for general adult medical examination with abnormal findings: Secondary | ICD-10-CM | POA: Insufficient documentation

## 2018-05-09 DIAGNOSIS — R739 Hyperglycemia, unspecified: Secondary | ICD-10-CM

## 2018-05-09 NOTE — Telephone Encounter (Signed)
-----   Message from Alvina Chouerri J Walsh sent at 05/09/2018 10:26 AM EDT ----- Regarding: Lab orders for Friday, 7.12.19 Patient is scheduled for CPX labs, please order future labs, Thanks , Camelia Engerri

## 2018-05-18 ENCOUNTER — Telehealth: Payer: Self-pay | Admitting: Family Medicine

## 2018-05-18 NOTE — Telephone Encounter (Signed)
Copied from CRM [id number]. Topic: General - Other >> May 18, 2018  5:49 PM Darletta MollLander, Lumin L wrote: Reason for CRM: Patient needs clinical to fax his last 30 days of cpap results and see that he uses it at least 4hours a night at at least 70% and that he's had an actual visit within a year regarding this with his PCP to go to [facility] [place]. 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 FAX:[phone]. Please call patient for additional information. They need a print out of the last 30 days on his chip.

## 2018-05-19 ENCOUNTER — Other Ambulatory Visit: Payer: BLUE CROSS/BLUE SHIELD

## 2018-05-19 ENCOUNTER — Telehealth: Payer: Self-pay | Admitting: Pulmonary Disease

## 2018-05-19 NOTE — Telephone Encounter (Signed)
SD card download was done by Corrine. OV note and download report have been given to the pt. Nothing further was needed at this time.

## 2018-05-19 NOTE — Telephone Encounter (Signed)
I do not handle cpap Thanks for cc to pulmonary Please check to see that they address it today Thanks

## 2018-05-19 NOTE — Telephone Encounter (Signed)
Spoke with pt. He is wanting to bring his SD card by the office today to have it downloaded. Advised him that would be fine and we could give him a copy of the download. Pt also needs a copy of his last OV note. Will leave message open to follow up on later today.

## 2018-05-19 NOTE — Telephone Encounter (Signed)
Pt has arrived in lobby with SD card and needs OV notes showing he was seen

## 2018-05-19 NOTE — Telephone Encounter (Signed)
Will rout to PCP and pulmonary since we don't handle his Cpap

## 2018-05-19 NOTE — Telephone Encounter (Signed)
Pt notified of Dr. Royden Purlower's comments and he said he did get it taken care of yesterday

## 2018-05-24 ENCOUNTER — Encounter: Payer: BLUE CROSS/BLUE SHIELD | Admitting: Family Medicine

## 2018-07-19 ENCOUNTER — Encounter: Payer: BLUE CROSS/BLUE SHIELD | Admitting: Family Medicine

## 2018-07-19 DIAGNOSIS — Z0289 Encounter for other administrative examinations: Secondary | ICD-10-CM

## 2019-03-07 ENCOUNTER — Other Ambulatory Visit: Payer: Self-pay

## 2019-03-07 ENCOUNTER — Telehealth: Payer: Self-pay | Admitting: Family Medicine

## 2019-03-07 MED ORDER — FLUTICASONE PROPIONATE 50 MCG/ACT NA SUSP: 2.0000 | Freq: Every day | NASAL | 11 refills | Status: DC

## 2019-03-07 NOTE — Telephone Encounter (Signed)
Rx has been sent  

## 2019-03-07 NOTE — Telephone Encounter (Signed)
Patient is needing a refill   FLONASE  (3 month)   Completely out of medication  [facility] [address] [address]

## 2019-04-07 ENCOUNTER — Encounter: Payer: Self-pay | Admitting: Emergency Medicine

## 2019-04-07 ENCOUNTER — Emergency Department
Admission: EM | Admit: 2019-04-07 | Discharge: 2019-04-07 | Disposition: A | Discharge status: Home / Self Care | Payer: BLUE CROSS/BLUE SHIELD | Attending: Emergency Medicine | Admitting: Emergency Medicine

## 2019-04-07 ENCOUNTER — Other Ambulatory Visit: Payer: Self-pay

## 2019-04-07 DIAGNOSIS — K625 Hemorrhage of anus and rectum: Secondary | ICD-10-CM | POA: Diagnosis present

## 2019-04-07 DIAGNOSIS — Z79899 Other long term (current) drug therapy: Secondary | ICD-10-CM | POA: Diagnosis not present

## 2019-04-07 LAB — CBC
HCT: 43.1 % (ref 39.0–52.0)
HCT: 40.7 % (ref 39.0–52.0)
Hemoglobin: 14.3 g/dL (ref 13.0–17.0)
Hemoglobin: 13.7 g/dL (ref 13.0–17.0)
MCH: 28.4 pg (ref 26.0–34.0)
MCH: 28.7 pg (ref 26.0–34.0)
MCHC: 33.2 g/dL (ref 30.0–36.0)
MCHC: 33.7 g/dL (ref 30.0–36.0)
MCV: 85.5 fL (ref 80.0–100.0)
MCV: 85.3 fL (ref 80.0–100.0)
Platelets: 292 10*3/uL (ref 150–400)
Platelets: 281 10*3/uL (ref 150–400)
RBC: 5.04 MIL/uL (ref 4.22–5.81)
RBC: 4.77 MIL/uL (ref 4.22–5.81)
RDW: 12.7 % (ref 11.5–15.5)
RDW: 12.7 % (ref 11.5–15.5)
WBC: 6.7 10*3/uL (ref 4.0–10.5)
WBC: 7.6 10*3/uL (ref 4.0–10.5)
nRBC: 0 % (ref 0.0–0.2)
nRBC: 0 % (ref 0.0–0.2)

## 2019-04-07 LAB — COMPREHENSIVE METABOLIC PANEL
ALT: 46 U/L — ABNORMAL HIGH (ref 0–44)
AST: 29 U/L (ref 15–41)
Albumin: 4.6 g/dL (ref 3.5–5.0)
Alkaline Phosphatase: 62 U/L (ref 38–126)
Anion gap: 8 (ref 5–15)
BUN: 11 mg/dL (ref 6–20)
CO2: 26 mmol/L (ref 22–32)
Calcium: 9.3 mg/dL (ref 8.9–10.3)
Chloride: 105 mmol/L (ref 98–111)
Creatinine, Ser: 0.96 mg/dL (ref 0.61–1.24)
GFR calc Af Amer: >60 mL/min (ref >60)
GFR calc non Af Amer: >60 mL/min (ref >60)
Glucose, Bld: 120 mg/dL — ABNORMAL HIGH (ref 70–99)
Potassium: 4.2 mmol/L (ref 3.5–5.1)
Sodium: 139 mmol/L (ref 135–145)
Total Bilirubin: 0.8 mg/dL (ref 0.3–1.2)
Total Protein: 7.9 g/dL (ref 6.5–8.1)

## 2019-04-07 LAB — TYPE AND SCREEN
ABO/RH(D): A POS
Antibody Screen: NEGATIVE

## 2019-04-07 MED ORDER — SENNOSIDES-DOCUSATE SODIUM 8.6-50 MG PO TABS: 2.0000 | ORAL_TABLET | Freq: Two times a day (BID) | ORAL | 0 refills | Status: DC

## 2019-04-07 NOTE — ED Triage Notes (Signed)
Pt arrived via POV with reports of blood in stool for the past week, pt reports there is no blood in stool sometimes when he goes to the bathroom. Pt denies any rectal or abdominal pain. Denies vomiting.   Pt has mask on already on arrival.

## 2019-04-07 NOTE — ED Provider Notes (Signed)
[facility] Emergency Department Provider Note  ____________________________________________  Time seen: Approximately 7:56 PM  I have reviewed the triage vital signs and the nursing notes.   HISTORY  Chief Complaint GI Bleeding    HPI Joseph Maddox is a 38 y.o. male with a past medical history of obesity, anxiety, sleep apnea who comes the ED complaining of bloody stools for the past 4 days.  He normally has about 4 bowel movements a day and sees blood in the toilet about half the time.  Today he has had 2 bowel movements without blood.  Denies any nausea vomiting or abdominal pain.  Denies fevers chills body aches, never had anything like this before.  Denies constipation or hard stools.  Symptoms are intermittent without aggravating or alleviating factors.  No radiating pain.      Past Medical History:  Diagnosis Date  . Allergic rhinitis   . Sleep apnea      Patient Active Problem List   Diagnosis Date Noted  . Routine general medical examination at a health care facility 05/09/2018  . Nasal congestion 01/13/2018  . Shakiness 02/05/2015  . Benign paroxysmal positional vertigo 01/15/2015  . Post-nasal drip 01/15/2015  . OSA (obstructive sleep apnea) 02/16/2013  . Somnolence, daytime 02/16/2013  . Morbid obesity (HCC) 02/16/2013  . Quit smoking 03/01/2012  . Hyperglycemia 07/20/2011  . Anxiety 07/06/2011     History reviewed. No pertinent surgical history.   Prior to Admission medications   Medication Sig Start Date End Date Taking? Authorizing Provider  cetirizine (ZYRTEC) 10 MG tablet Take 10 mg by mouth daily.    [provider]  fluticasone (FLONASE) 50 MCG/ACT nasal spray Place 2 sprays into both nostrils daily. 03/07/19   Tower, Audrie GallusMarne A, MD  senna-docusate (SENOKOT-S) 8.6-50 MG tablet Take 2 tablets by mouth 2 (two) times daily. 04/07/19   Sharman CheekStafford, Londan Coplen, MD     Allergies Patient has no known allergies.   Family  History  Problem Relation Age of Onset  . Hypertension Other        Fam hx  . Hypertension Mother   . Hypertension Father   . Stroke Maternal Grandmother   . Cancer Maternal Grandfather        prostate CA    Social History Social History   Tobacco Use  . Smoking status: Former Smoker    Packs/day: 0.75    Years: 6.00    Pack years: 4.50    Types: Cigars    Last attempt to quit: 12/11/2012    Years since quitting: 6.3  . Smokeless tobacco: Never Used  Substance Use Topics  . Alcohol use: Yes    Alcohol/week: 0.0 standard drinks    Comment: occassionally  . Drug use: No    Review of Systems  Constitutional:   No fever or chills.  ENT:   No sore throat. No rhinorrhea. Cardiovascular:   No chest pain or syncope. Respiratory:   No dyspnea or cough. Gastrointestinal:   Negative for abdominal pain, vomiting and diarrhea.  Positive for rectal bleeding. Musculoskeletal:   Negative for focal pain or swelling All other systems reviewed and are negative except as documented above in ROS and HPI.  ____________________________________________   PHYSICAL EXAM:  VITAL SIGNS: ED Triage Vitals  Enc Vitals Group     BP 04/07/19 1210 (!) 175/81     Pulse Rate 04/07/19 1210 100     Resp 04/07/19 1210 20     Temp 04/07/19 1210 99.2  F (37.3 C)     Temp Source 04/07/19 1210 Oral     SpO2 04/07/19 1210 99 %     Weight 04/07/19 1211 280 lb (127 kg)     Height 04/07/19 1211  (1.676 m)     Head Circumference --      Peak Flow --      Pain Score 04/07/19 1210 0     Pain Loc --      Pain Edu? --      Excl. in GC? --     Vital signs reviewed, nursing assessments reviewed.   Constitutional:   Alert and oriented. Non-toxic appearance. Eyes:   Conjunctivae are normal. EOMI. PERRL. ENT      Head:   Normocephalic and atraumatic.      Nose:   No congestion/rhinnorhea.       Mouth/Throat:   MMM, no pharyngeal erythema. No peritonsillar mass.       Neck:   No meningismus. Full  ROM. Hematological/Lymphatic/Immunilogical:   No cervical lymphadenopathy. Cardiovascular:   RRR. Symmetric bilateral radial and DP pulses.  No murmurs. Cap refill less than 2 seconds. Respiratory:   Normal respiratory effort without tachypnea/retractions. Breath sounds are clear and equal bilaterally. No wheezes/rales/rhonchi. Gastrointestinal:   Soft and nontender. Non distended. There is no CVA tenderness.  No rebound, rigidity, or guarding.  Rectal exam negative for gross blood, Hemoccult negative.  No hemorrhoids  Musculoskeletal:   Normal range of motion in all extremities. No joint effusions.  No lower extremity tenderness.  No edema. Neurologic:   Normal speech and language.  Motor grossly intact. No acute focal neurologic deficits are appreciated.  Skin:    Skin is warm, dry and intact. No rash noted.  No petechiae, purpura, or bullae.  ____________________________________________    LABS (pertinent positives/negatives) (all labs ordered are listed, but only abnormal results are displayed) Labs Reviewed  COMPREHENSIVE METABOLIC PANEL - Abnormal; Notable for the following components:      Result Value   Glucose, Bld 120 (*)    ALT 46 (*)    All other components within normal limits  CBC  CBC  TYPE AND SCREEN   ____________________________________________   EKG    ____________________________________________    RADIOLOGY  No results found.  ____________________________________________   PROCEDURES Procedures  ____________________________________________    CLINICAL IMPRESSION / ASSESSMENT AND PLAN / ED COURSE  Medications ordered in the ED: Medications - No data to display  Pertinent labs & imaging results that were available during my care of the patient were reviewed by me and considered in my medical decision making (see chart for details).  Joseph Maddox was evaluated in Emergency Department on 04/07/2019 for the symptoms described in the history  of present illness. He was evaluated in the context of the global COVID-19 pandemic, which necessitated consideration that the patient might be at risk for infection with the SARS-CoV-2 virus that causes COVID-19. Institutional protocols and algorithms that pertain to the evaluation of patients at risk for COVID-19 are in a state of rapid change based on information released by regulatory bodies including the CDC and federal and state organizations. These policies and algorithms were followed during the patient's care in the ED.   Patient presents with complaint of rectal bleeding.  Vital signs are unremarkable.  Labs are unremarkable.  Serial hemoglobin is unchanged within lab variance.  Exam is reassuring.  Will refer to outpatient gastroenterology follow-up for further evaluation of his rectal bleeding, not on  anticoagulants, not requiring hospitalization or urgent consultation at this time.      ____________________________________________   FINAL CLINICAL IMPRESSION(S) / ED DIAGNOSES    Final diagnoses:  Rectal bleeding     ED Discharge Orders         Ordered    senna-docusate (SENOKOT-S) 8.6-50 MG tablet  2 times daily     04/07/19 1956          Portions of this note were generated with dragon dictation software. Dictation errors may occur despite best attempts at proofreading.   Sharman Cheek, MD 04/07/19 [place]

## 2019-04-07 NOTE — ED Notes (Signed)
Bloody stools for last 4 days, almost every time he uses restroom. Mixed bright and red blood. Denies pain.

## 2019-04-07 NOTE — ED Notes (Signed)
Signature pad not available at this time. Patient signed paper copyof dischare instructions and placed on the chart.

## 2019-04-07 NOTE — ED Notes (Signed)
Pt alert and oriented. Pt appears comfortable, no c/o pain. Reassured that staff is working as quickly as possible for him to be seen by MD.

## 2019-04-09 ENCOUNTER — Telehealth: Payer: Self-pay

## 2019-04-09 NOTE — Telephone Encounter (Signed)
[facility] Night - Client TELEPHONE ADVICE RECORD [facility] Medical Call Center Patient Name: Joseph Maddox Gender: Male DOB: 07-20-1981 Age: 38 Y 11 M 17 D Return Phone Number: [phone] (Primary) Address: [address] City/State/Zip: [place] [address] Client [facility] Night - Client Client Site [facility] - Night Physician Tower, Idamae Schuller - MD Contact Type Call Who Is Calling Patient / Member / Family / Caregiver Call Type Triage / Clinical Relationship To Patient Self Return Phone Number [phone] (Primary) Chief Complaint Rectal Bleeding Reason for Call Symptomatic / Request for Health Information Initial Comment Caller is not sure if there is blood in his stool but he does have rectal bleeding for the past week and no pain. Translation No Nurse Assessment Nurse: Violeta Gelinas, RN, Regulatory affairs officer (Eastern Time): 04/07/2019 10:38:14 AM Confirm and document reason for call. If symptomatic, describe symptoms. ---Caller states that he has been having rectal bleeding for the past week, no pain, toilet almost full with blood. Has the patient had close contact with a person known or suspected to have the novel coronavirus illness OR traveled / lives in area with major community spread (including international travel) in the last 14 days from the onset of symptoms? * If Asymptomatic, screen for exposure and travel within the last 14 days. ---No Does the patient have any new or worsening symptoms? ---Yes Will a triage be completed? ---Yes Related visit to physician within the last 2 weeks? ---No Does the PT have any chronic conditions? (i.e. diabetes, asthma, this includes High risk factors for pregnancy, etc.) ---No Is this a behavioral health or substance abuse call? ---No Guidelines Guideline Title Affirmed Question Affirmed Notes Nurse Date/Time Lamount Cohen Time) Rectal Bleeding [1] MODERATE  rectal bleeding (small blood clots, passing blood without stool, or toilet water turns red) AND [2] more than once a day Violeta Gelinas, RN, [facility] 04/07/2019 10:37:26 AM Disp. Time Lamount Cohen Time) Disposition Final User PLEASE NOTE: All timestamps contained within this report are represented as [place] Standard Time. CONFIDENTIALTY NOTICE: This fax transmission is intended only for the addressee. It contains information that is legally privileged, confidential or otherwise protected from use or disclosure. If you are not the intended recipient, you are strictly prohibited from reviewing, disclosing, copying using or disseminating any of this information or taking any action in reliance on or regarding this information. If you have received this fax in error, please notify us immediately by telephone so that we can arrange for its return to [place]. Phone: [phone], Toll-Free: [phone], Fax: [phone] Page: 2 of 2 Call Id: [id number] 04/07/2019 10:42:42 AM Go to ED Now Yes Violeta Gelinas, RN, Amber Caller Disagree/Comply Comply Caller Understands Yes PreDisposition Did not know what to do Care Advice Given Per Guideline GO TO ED NOW: * You need to be seen in the Emergency Department. DRIVING: Another adult should drive. Do not delay going to the Emergency Department. If immediate transportation is not available via car or taxi, then the patient should be instructed to call [id number]. BRING MEDICINES: * Please bring a list of your current medicines when you go to the Emergency Department (ER). CARE ADVICE given per Rectal Bleeding (Adult) guideline. Comments User: Adriana Simas, RN Date/Time Lamount Cohen Time): 04/07/2019 11:00:28 AM caller called back and asked if can go to UC. advised that needs to go to ER. Referrals [facility] - ED

## 2019-04-09 NOTE — Telephone Encounter (Signed)
I reviewed the note and it looks like they recommended GI visit for rectal bleeding.  How is he doing?  Would he like a referral?

## 2019-04-09 NOTE — Telephone Encounter (Signed)
Patient seen in the ER for his symptoms.  Please refer to ER note.

## 2019-04-10 NOTE — Telephone Encounter (Signed)
Patient returned call from the office, Advised of messages below.  He stated he has already spoken with GI and did not need a referral placed. That the ER done this for him  Patient stated that he is schedule on June 11th to see them

## 2019-04-10 NOTE — Telephone Encounter (Signed)
Left VM requesting pt to call the office back 

## 2019-05-04 ENCOUNTER — Encounter: Admission: RE | Admit: 2019-05-04 | Payer: BLUE CROSS/BLUE SHIELD | Source: Ambulatory Visit

## 2019-05-08 ENCOUNTER — Ambulatory Visit
Admission: RE | Admit: 2019-05-08 | Payer: BLUE CROSS/BLUE SHIELD | Source: Home / Self Care | Admitting: Internal Medicine

## 2019-05-08 ENCOUNTER — Encounter: Admission: RE | Payer: Self-pay | Source: Home / Self Care

## 2019-05-08 SURGERY — COLONOSCOPY WITH PROPOFOL
Anesthesia: General

## 2019-11-06 ENCOUNTER — Ambulatory Visit: Payer: BLUE CROSS/BLUE SHIELD | Admitting: Family Medicine

## 2019-11-06 ENCOUNTER — Telehealth: Payer: Self-pay

## 2019-11-06 NOTE — Telephone Encounter (Signed)
Pt already called in and rescheduled.

## 2019-11-06 NOTE — Telephone Encounter (Signed)
Received v/m by cold call transfer from [facility]; pt wants to change todays appt to first of year so ins will cover.

## 2019-11-12 ENCOUNTER — Other Ambulatory Visit: Payer: Self-pay

## 2019-11-12 ENCOUNTER — Encounter: Payer: Self-pay | Admitting: Family Medicine

## 2019-11-12 ENCOUNTER — Ambulatory Visit (INDEPENDENT_AMBULATORY_CARE_PROVIDER_SITE_OTHER): Payer: 59 | Admitting: Family Medicine

## 2019-11-12 VITALS — BP 130/85 | HR 79 | Temp 98.6°F | Ht 66.0 in | Wt 302.1 lb

## 2019-11-12 DIAGNOSIS — Z23 Encounter for immunization: Secondary | ICD-10-CM | POA: Diagnosis not present

## 2019-11-12 DIAGNOSIS — H8113 Benign paroxysmal vertigo, bilateral: Secondary | ICD-10-CM | POA: Diagnosis not present

## 2019-11-12 MED ORDER — MECLIZINE HCL 25 MG PO TABS: 25.0000 mg | ORAL_TABLET | Freq: Three times a day (TID) | ORAL | 0 refills | Status: DC | PRN

## 2019-11-12 NOTE — Assessment & Plan Note (Signed)
Weight gain noted Very poor diet of late  (eating out/ some beer/ fast food)  Discussed how this problem influences overall health and the risks it imposes  Reviewed plan for weight loss with lower calorie diet (via better food choices and also portion control or program like weight watchers) and exercise building up to or more than 30 minutes 5 days per week including some aerobic activity   Discussed a strategy to assemble healthier meals and snacks at home  Also exercise

## 2019-11-12 NOTE — Progress Notes (Signed)
Subjective:    Patient ID: Joseph Maddox, male    DOB: 1981-05-13, 39 y.o.   MRN: [id number]  This visit occurred during the SARS-CoV-2 public health emergency.  Safety protocols were in place, including screening questions prior to the visit, additional usage of staff PPE, and extensive cleaning of exam room while observing appropriate contact time as indicated for disinfecting solutions.    HPI Pt presents with dizziness  Started 2 weeks ago   Also needs a flu shot   Wt Readings from Last 3 Encounters:  11/12/19 (!) 302 lb 1 oz (137 kg)  04/07/19 280 lb (127 kg)  02/27/18 265 lb (120.2 kg)   48.75 kg/m   BP Readings from Last 3 Encounters:  11/12/19 (!) 136/94  04/07/19 (!) 160/104  02/27/18 (!) 128/92   Pulse Readings from Last 3 Encounters:  11/12/19 79  04/07/19 92  02/27/18 64   He had vertigo in 2016   2 weeks ago-his eating habits were off /eating only once per day due to being busy Had a beer one day  Then woke up dizzy  Felt like he was spinning- he could not stand up   Symptoms are worst when lying down- at night and in the am  It then improves through the day  Is a little foggy and slight headache  (tylenol helps)  Has nasal congestion from allergies- baseline/not worse than usual   No ear symptoms   Pressure around eyes No purulent nasal d/c  No cough   Normally drinks enough fluids (1/2 to 1 gallon per day) - but may have become dehydrated right before this started   He is starting to eat better now   This am - was dizzy/has to sit at edge of bed (not as bad but still there)  Right now -not dizzy at all   No falls or head injuries        Review of Systems  Constitutional: Negative for activity change, appetite change, fatigue, fever and unexpected weight change.  HENT: Negative for congestion, rhinorrhea, sore throat and trouble swallowing.   Eyes: Negative for pain, redness, itching and visual disturbance.  Respiratory: Negative for  cough, chest tightness, shortness of breath and wheezing.   Cardiovascular: Negative for chest pain and palpitations.  Gastrointestinal: Negative for abdominal pain, blood in stool, constipation, diarrhea and nausea.  Endocrine: Negative for cold intolerance, heat intolerance, polydipsia and polyuria.  Genitourinary: Negative for difficulty urinating, dysuria, frequency and urgency.  Musculoskeletal: Negative for arthralgias, joint swelling and myalgias.  Skin: Negative for pallor and rash.  Allergic/Immunologic: Positive for environmental allergies.  Neurological: Positive for dizziness and headaches. Negative for tremors, syncope, facial asymmetry, speech difficulty, weakness and numbness.  Hematological: Negative for adenopathy. Does not bruise/bleed easily.  Psychiatric/Behavioral: Negative for decreased concentration and dysphoric mood. The patient is not nervous/anxious.        Objective:   Physical Exam Constitutional:      General: He is not in acute distress.    Appearance: He is well-developed.  HENT:     Head: Normocephalic and atraumatic.     Right Ear: External ear normal.     Left Ear: External ear normal.     Nose: Nose normal.     Mouth/Throat:     Pharynx: No oropharyngeal exudate.  Eyes:     General: No scleral icterus.       Right eye: No discharge.        Left eye: No discharge.  Conjunctiva/sclera: Conjunctivae normal.     Pupils: Pupils are equal, round, and reactive to light.     Comments: No nystagmus  Neck:     Thyroid: No thyromegaly.     Vascular: No carotid bruit or JVD.     Trachea: No tracheal deviation.  Cardiovascular:     Rate and Rhythm: Normal rate and regular rhythm.     Heart sounds: Normal heart sounds. No murmur.  Pulmonary:     Effort: Pulmonary effort is normal. No respiratory distress.     Breath sounds: Normal breath sounds. No wheezing or rales.  Abdominal:     General: Bowel sounds are normal. There is no distension.      Palpations: Abdomen is soft. There is no mass.     Tenderness: There is no abdominal tenderness.  Musculoskeletal:        General: No tenderness.     Cervical back: Full passive range of motion without pain, normal range of motion and neck supple.  Lymphadenopathy:     Cervical: No cervical adenopathy.  Skin:    General: Skin is warm and dry.     Coloration: Skin is not pale.     Findings: No rash.  Neurological:     Mental Status: He is alert and oriented to person, place, and time.     Cranial Nerves: No cranial nerve deficit.     Sensory: No sensory deficit.     Motor: No tremor, atrophy or abnormal muscle tone.     Coordination: Coordination normal.     Gait: Gait normal.     Deep Tendon Reflexes: Reflexes are normal and symmetric.     Comments: No focal cerebellar signs   Psychiatric:        Behavior: Behavior normal.        Thought Content: Thought content normal.           Assessment & Plan:   Problem List Items Addressed This Visit      Nervous and Auditory   Benign paroxysmal positional vertigo - Primary    Pt has had this once before and it resolved  2 wk of symptoms- assoc with position change worse in am  Given handouts re: vertigo as well as the Epeley maneuver  This may be related to sinus pressure inst to inc flonase to bid for a week and also alert [place] if any s/s of sinus infection Tylenol is ok  Px meclizine for use/primarily at night as needed for dizziness Update if not starting to improve in a week or if worsening          Other   Morbid obesity ([place])    Weight gain noted Very poor diet of late  (eating out/ some beer/ fast food)  Discussed how this problem influences overall health and the risks it imposes  Reviewed plan for weight loss with lower calorie diet (via better food choices and also portion control or program like weight watchers) and exercise building up to or more than 30 minutes 5 days per week including some aerobic activity    Discussed a strategy to assemble healthier meals and snacks at home  Also exercise       Other Visit Diagnoses    Need for influenza vaccination       Relevant Orders   Flu Vaccine QUAD 6+ mos PF IM (Fluarix Quad PF) (Completed)

## 2019-11-12 NOTE — Patient Instructions (Addendum)
Try to eat healthier (less junk)  Eat more often/smaller amounts and healthier foods  Try to get more fruits and veggies also  Drink your water also   Increase flonase to twice daily for a week (then go back to once daily)  This may help get sinuses open and help dizziness Tylenol is ok for headache   Try meclizine at night for dizziness - caution of sedation  Try this for a little while as you get better  Here is a handout on vertigo and also Epeley maneuver    If symptoms worsen or change please let us know  If not improving further in a week let us know   Change position slowly-especially in the morning

## 2019-11-12 NOTE — Assessment & Plan Note (Signed)
Pt has had this once before and it resolved  2 wk of symptoms- assoc with position change worse in am  Given handouts re: vertigo as well as the Epeley maneuver  This may be related to sinus pressure inst to inc flonase to bid for a week and also alert [place] if any s/s of sinus infection Tylenol is ok  Px meclizine for use/primarily at night as needed for dizziness Update if not starting to improve in a week or if worsening

## 2019-12-20 ENCOUNTER — Telehealth: Payer: Self-pay

## 2019-12-20 MED ORDER — FLUTICASONE PROPIONATE 50 MCG/ACT NA SUSP: 2.0000 | Freq: Two times a day (BID) | NASAL | 11 refills | Status: DC

## 2019-12-20 NOTE — Telephone Encounter (Signed)
I sent it  

## 2019-12-20 NOTE — Telephone Encounter (Signed)
Pt notified Rx sent 

## 2019-12-20 NOTE — Telephone Encounter (Signed)
Pt said while having vertigo Dr Milinda Antis increased flonase to 2 sprays bid and pt was not having stuffy nose. Now vertigo is gone and using flonase once daily and having stuffy nose again. Pt wants to know if new rx for flonase with bid instructions could be sent to [facility] [address].Please advise.

## 2020-02-19 ENCOUNTER — Telehealth: Payer: Self-pay | Admitting: Family Medicine

## 2020-02-19 MED ORDER — FLUTICASONE PROPIONATE 50 MCG/ACT NA SUSP: 2.0000 | Freq: Two times a day (BID) | NASAL | 11 refills | Status: DC

## 2020-02-19 NOTE — Telephone Encounter (Signed)
The max dose recommended is 2 sprays in each nostril twice daily  (that equals a total of 4 sprays per nostril total per day- but please do divide up into twice daily)   This may be a change from the past  Too many sprays at once may thin the mucosa inside the nose   I will re send px with disp 32 g instead of 16 g   If it is too early regardless and insurance will not cover he will need to get otc to bridge the gap  (a store band will be cheaper than name brand)

## 2020-02-19 NOTE — Telephone Encounter (Signed)
Pt aware.

## 2020-02-19 NOTE — Telephone Encounter (Signed)
[facility] and they advised that even with the override in dose pt's insurance will not refill it early they said the earliest he can get a refill is in 2 days. There is nothing we can do on our end it's pt's insurance. Pharmacist did advise [place] that pt can get OTC Flonase at [facility] for around $8-$10. And that will "hold" him until his insurance will cover the med. Called pt and advise him of this info

## 2020-02-19 NOTE — Telephone Encounter (Signed)
Patient called today in regards to his Flonase prescription.  He stated back in February he called and requested for the Flonase to be prescribed for 4 sprays in reach nostril instead of just 2.  He thought it was sent in for this.  So patient called pharmacy to refill because he is completely out since he is doing 4 sprays and they are not able to refill rx yet.  Patient would like to know if a temporary script could be sent in so he does not have to do without the Flonase .    [facility] [address]

## 2020-02-25 ENCOUNTER — Telehealth: Payer: Self-pay | Admitting: *Deleted

## 2020-02-25 NOTE — Telephone Encounter (Signed)
Form faxed

## 2020-02-25 NOTE — Telephone Encounter (Signed)
Received a fax saying that the quantity for the Flonase is higher then the quantity limits for pt's insurance and they would like [place] to fill out a quantity limit exception form. I filled out what I could but form in Dr. Royden Purl inbox for review

## 2020-02-25 NOTE — Telephone Encounter (Signed)
I placed it in IN box to fax He may end up having to get otc

## 2020-02-26 NOTE — Telephone Encounter (Signed)
PA was approved. Pharmacy advised and letter placed in Dr. Royden Purl inbox to sign and send for scanning

## 2020-07-31 ENCOUNTER — Telehealth: Payer: Self-pay | Admitting: *Deleted

## 2020-07-31 NOTE — Telephone Encounter (Signed)
Pt notified of Dr. Tower comments and verbalized understanding  

## 2020-07-31 NOTE — Telephone Encounter (Signed)
Nasal saline spray is always fine   He can double up on flonase for a week-use twice daily , then return to once   Sudafed would be ok for him with caution -it can raise bp and pulse and cause jitteriness but he would likely be ok with that Let him know he will need to sign at the pharmacy for that medicine   F/u -virtual if needed /not improved (or UC)

## 2020-07-31 NOTE — Telephone Encounter (Signed)
Pt left message at triage. Pt said he is dealing with sinus issues and wants to know what OTC medications are safe for him to take. Can he take a decongest medication? Pt said he doesn't have any cough or chest congestion it's all in his sinuses pt said he is taking zyrtec and flonase but wanted to know what else he can take to help. Pt said he is sinus pressure and a sinus HA, with some dizziness and nasal congestion. He said his sinuses are swollen so he can't breath out of his nose but he isn't having trouble breathing. I did advise pt that he should get tested to be on the safe side he could have a mild case of covid (pt's vaccinated) but pt doesn't think it's covid and wants Dr. Royden Purl recommendations on OTC sinus meds. Pt said he gets sinus issues every year around this time.

## 2020-08-04 ENCOUNTER — Other Ambulatory Visit: Payer: Self-pay

## 2020-08-04 ENCOUNTER — Encounter: Payer: Self-pay | Admitting: *Deleted

## 2020-08-04 ENCOUNTER — Emergency Department: Payer: 59

## 2020-08-04 DIAGNOSIS — R0981 Nasal congestion: Secondary | ICD-10-CM | POA: Diagnosis not present

## 2020-08-04 DIAGNOSIS — Z20822 Contact with and (suspected) exposure to covid-19: Secondary | ICD-10-CM | POA: Diagnosis not present

## 2020-08-04 DIAGNOSIS — Z87891 Personal history of nicotine dependence: Secondary | ICD-10-CM | POA: Diagnosis not present

## 2020-08-04 DIAGNOSIS — R079 Chest pain, unspecified: Secondary | ICD-10-CM | POA: Insufficient documentation

## 2020-08-04 DIAGNOSIS — R0789 Other chest pain: Secondary | ICD-10-CM | POA: Diagnosis present

## 2020-08-04 LAB — BASIC METABOLIC PANEL
Anion gap: 9 (ref 5–15)
BUN: 16 mg/dL (ref 6–20)
CO2: 26 mmol/L (ref 22–32)
Calcium: 9.7 mg/dL (ref 8.9–10.3)
Chloride: 104 mmol/L (ref 98–111)
Creatinine, Ser: 1.03 mg/dL (ref 0.61–1.24)
GFR calc Af Amer: >60 mL/min (ref >60)
GFR calc non Af Amer: >60 mL/min (ref >60)
Glucose, Bld: 120 mg/dL — ABNORMAL HIGH (ref 70–99)
Potassium: 4.3 mmol/L (ref 3.5–5.1)
Sodium: 139 mmol/L (ref 135–145)

## 2020-08-04 LAB — CBC
HCT: 43.4 % (ref 39.0–52.0)
Hemoglobin: 14.7 g/dL (ref 13.0–17.0)
MCH: 28.7 pg (ref 26.0–34.0)
MCHC: 33.9 g/dL (ref 30.0–36.0)
MCV: 84.6 fL (ref 80.0–100.0)
Platelets: 304 10*3/uL (ref 150–400)
RBC: 5.13 MIL/uL (ref 4.22–5.81)
RDW: 12.2 % (ref 11.5–15.5)
WBC: 8.9 10*3/uL (ref 4.0–10.5)
nRBC: 0 % (ref 0.0–0.2)

## 2020-08-04 LAB — TROPONIN I (HIGH SENSITIVITY)
Troponin I (High Sensitivity): 6 ng/L (ref <18)
Troponin I (High Sensitivity): 8 ng/L (ref <18)

## 2020-08-04 NOTE — ED Triage Notes (Signed)
Pt reports chest pressure and nasal congestion for 1 week.  Pt has  sob. Pt on cpap.   Pt alert   Speech clear

## 2020-08-05 ENCOUNTER — Telehealth: Payer: Self-pay

## 2020-08-05 ENCOUNTER — Emergency Department
Admission: EM | Admit: 2020-08-05 | Discharge: 2020-08-05 | Disposition: A | Discharge status: Home / Self Care | Payer: 59 | Attending: Emergency Medicine | Admitting: Emergency Medicine

## 2020-08-05 DIAGNOSIS — R079 Chest pain, unspecified: Secondary | ICD-10-CM

## 2020-08-05 LAB — RESPIRATORY PANEL BY RT PCR (FLU A&B, COVID)
Influenza A by PCR: NEGATIVE
Influenza B by PCR: NEGATIVE
SARS Coronavirus 2 by RT PCR: NEGATIVE

## 2020-08-05 NOTE — Telephone Encounter (Signed)
[facility] Day - Client TELEPHONE ADVICE RECORD [facility] Patient Name: Joseph Maddox Gender: Male DOB: 11-12-1980 Age: 39 Y 3 M 14 D Return Phone Number: [phone] (Primary) Address: [address] City/State/Zip: [place] [address] Client [facility] Day - Client Client Site [facility] - Day Physician Milinda Antis, Idamae Schuller - MD Contact Type Call Who Is Calling Patient / Member / Family / Caregiver Call Type Triage / Clinical Relationship To Patient Self Return Phone Number [phone] (Primary) Chief Complaint CHEST PAIN (>=21 years) - pain, pressure, heaviness or tightness Reason for Call Symptomatic / Request for Health Information Initial Comment Caller states he has nasal congestion and burning in his lungs and heaviness in his chest. Translation No Nurse Assessment Nurse: Darcus Austin, RN, Sarah Date/Time Lamount Cohen Time): 08/04/2020 4:58:30 PM Confirm and document reason for call. If symptomatic, describe symptoms. ---Caller states for about a week has been having nasal congestion. States he is feeling a heaviness on his chest when laying down. Sometimes feels a burning sensation in his lungs. No cough or fever. Does the patient have any new or worsening symptoms? ---Yes Will a triage be completed? ---Yes Related visit to physician within the last 2 weeks? ---No Does the PT have any chronic conditions? (i.e. diabetes, asthma, this includes High risk factors for pregnancy, etc.) ---Yes List chronic conditions. ---Obese and sleep apnea. Is this a behavioral health or substance abuse call? ---No Guidelines Guideline Title Affirmed Question Affirmed Notes Nurse Date/Time Lamount Cohen Time) Chest Pain Difficulty breathing Goins, RN, Maralyn Sago 08/04/2020 5:01:11 PM Disp. Time Lamount Cohen Time) Disposition Final User 08/04/2020 4:56:21 PM Send to Urgent Carmelina Peal 08/04/2020 5:03:22 PM Go to ED Now Yes Goins, RN,  Karren Cobble Disagree/Comply Comply Caller Understands Yes PLEASE NOTE: All timestamps contained within this report are represented as [place] Standard Time. CONFIDENTIALTY NOTICE: This fax transmission is intended only for the addressee. It contains information that is legally privileged, confidential or otherwise protected from use or disclosure. If you are not the intended recipient, you are strictly prohibited from reviewing, disclosing, copying using or disseminating any of this information or taking any action in reliance on or regarding this information. If you have received this fax in error, please notify us immediately by telephone so that we can arrange for its return to [place]. Phone: [phone], Toll-Free: [phone], Fax: [phone] Page: 2 of 2 Call Id: [id number] PreDisposition Call Doctor Care Advice Given Per Guideline GO TO ED NOW: * You need to be seen in the Emergency Department. CARE ADVICE given per Chest Pain (Adult) guideline. Referrals GO TO FACILITY UNDECIDED

## 2020-08-05 NOTE — ED Notes (Signed)
Assumed care of patient. Patient reports having a difficult time with breathing even while using his c-pap also having chest discomforts.

## 2020-08-05 NOTE — Discharge Instructions (Addendum)
Continue Flonase, and try increasing Zyrtec to two times a day for allergy symptoms.

## 2020-08-05 NOTE — Telephone Encounter (Signed)
Rose at phones said pt called and is still at [facility] ED; pt is going to remain at [facility] ED until seen and evaluated. FYI to Dr Milinda Antis.

## 2020-08-05 NOTE — ED Provider Notes (Signed)
[facility] Emergency Department Provider Note  ____________________________________________  Time seen: Approximately 9:02 AM  I have reviewed the triage vital signs and the nursing notes.   HISTORY  Chief Complaint Chest Pain    HPI Joseph Maddox is a 39 y.o. male with a history of obesity and sleep apnea who comes ED complaining of chest pressure for the past week associated with nasal congestion.  Only happens when his nose is stuffy.  At night when he is using his CPAP, it causes him to occasionally wake up feeling like he needs to catch his breath.  It is intermittent.  Not exertional.  Not radiating.  When asked about pain he says 0/10.  Denies any change in exercise tolerance.   He does note that he has seasonal allergies and takes Flonase and Zyrtec daily.     Past Medical History:  Diagnosis Date  . Allergic rhinitis   . Sleep apnea      Patient Active Problem List   Diagnosis Date Noted  . Routine general medical examination at a health care facility 05/09/2018  . Nasal congestion 01/13/2018  . Benign paroxysmal positional vertigo 01/15/2015  . Post-nasal drip 01/15/2015  . OSA (obstructive sleep apnea) 02/16/2013  . Somnolence, daytime 02/16/2013  . Morbid obesity (HCC) 02/16/2013  . Quit smoking 03/01/2012  . Hyperglycemia 07/20/2011  . Anxiety 07/06/2011     No past surgical history on file.   Prior to Admission medications   Medication Sig Start Date End Date Taking? Authorizing Provider  cetirizine (ZYRTEC) 10 MG tablet Take 10 mg by mouth daily.    [provider]  fluticasone (FLONASE) 50 MCG/ACT nasal spray Place 2 sprays into both nostrils 2 (two) times daily. 02/19/20   Tower, Audrie Gallus, MD  meclizine (ANTIVERT) 25 MG tablet Take 1 tablet (25 mg total) by mouth 3 (three) times daily as needed for dizziness. Caution of sedation 11/12/19   Tower, Audrie Gallus, MD     Allergies Patient has no known allergies.   Family  History  Problem Relation Age of Onset  . Hypertension Other        Fam hx  . Hypertension Mother   . Hypertension Father   . Stroke Maternal Grandmother   . Cancer Maternal Grandfather        prostate CA    Social History Social History   Tobacco Use  . Smoking status: Former Smoker    Packs/day: 0.75    Years: 6.00    Pack years: 4.50    Types: Cigars    Quit date: 12/11/2012    Years since quitting: 7.6  . Smokeless tobacco: Never Used  Substance Use Topics  . Alcohol use: Yes    Alcohol/week: 0.0 standard drinks    Comment: occassionally  . Drug use: No    Review of Systems  Constitutional:   No fever or chills.  ENT:   No sore throat.  Positive nasal congestion. Cardiovascular: Positive chest pressure without palpitations or syncope. Respiratory:   No dyspnea or cough. Gastrointestinal:   Negative for abdominal pain, vomiting and diarrhea.  Musculoskeletal:   Negative for focal pain or swelling All other systems reviewed and are negative except as documented above in ROS and HPI.  ____________________________________________   PHYSICAL EXAM:  VITAL SIGNS: ED Triage Vitals  Enc Vitals Group     BP 08/04/20 1905 (!) 169/102     Pulse Rate 08/04/20 1905 (!) 120     Resp 08/04/20  1905 (!) 22     Temp 08/04/20 1905 100.2 F (37.9 C)     Temp Source 08/04/20 1905 Oral     SpO2 08/04/20 1905 100 %     Weight 08/04/20 1903 (!) 302 lb (137 kg)     Height 08/04/20 1903 5\' 6"  (1.676 m)     Head Circumference --      Peak Flow --      Pain Score 08/04/20 1903 0     Pain Loc --      Pain Edu? --      Excl. in GC? --     Vital signs reviewed, nursing assessments reviewed.   Constitutional:   Alert and oriented. Non-toxic appearance. Eyes:   Conjunctivae are normal. EOMI. PERRL. ENT      Head:   Normocephalic and atraumatic.      Nose:   Wearing a mask.      Mouth/Throat:   Wearing a mask.      Neck:   No meningismus. Full  ROM. Hematological/Lymphatic/Immunilogical:   No cervical lymphadenopathy. Cardiovascular:   RRR, heart rate about 80. Symmetric bilateral radial and DP pulses.  No murmurs. Cap refill less than 2 seconds.  Heart sounds are not muffled required. Respiratory:   Normal respiratory effort without tachypnea/retractions. Breath sounds are clear and equal bilaterally. No wheezes/rales/rhonchi. Gastrointestinal:   Soft and nontender. Non distended. There is no CVA tenderness.  No rebound, rigidity, or guarding.  Musculoskeletal:   Normal range of motion in all extremities. No joint effusions.  No lower extremity tenderness.  No edema. Neurologic:   Normal speech and language.  Motor grossly intact. No acute focal neurologic deficits are appreciated.  Skin:    Skin is warm, dry and intact. No rash noted.  No petechiae, purpura, or bullae.  ____________________________________________    LABS (pertinent positives/negatives) (all labs ordered are listed, but only abnormal results are displayed) Labs Reviewed  BASIC METABOLIC PANEL - Abnormal; Notable for the following components:      Result Value   Glucose, Bld 120 (*)    All other components within normal limits  RESPIRATORY PANEL BY RT PCR (FLU A&B, COVID)  CBC  TROPONIN I (HIGH SENSITIVITY)  TROPONIN I (HIGH SENSITIVITY)   ____________________________________________   EKG  Interpreted by me Sinus tachycardia rate 128.  Normal axis intervals QRS ST segments and T waves.  ____________________________________________    RADIOLOGY  DG Chest 2 View  Result Date: 08/04/2020 CLINICAL DATA:  Chest pressure. Chest pain and nasal congestion. Shortness of breath. EXAM: CHEST - 2 VIEW COMPARISON:  None. FINDINGS: The cardiomediastinal contours are normal. The lungs are clear. Pulmonary vasculature is normal. No consolidation, pleural effusion, or pneumothorax. No acute osseous abnormalities are seen. IMPRESSION: Negative radiographs of the  chest. Electronically Signed   By: 08/06/2020 M.D.   On: 08/04/2020 19:20    ____________________________________________   PROCEDURES Procedures  ____________________________________________    CLINICAL IMPRESSION / ASSESSMENT AND PLAN / ED COURSE  Medications ordered in the ED: Medications - No data to display  Pertinent labs & imaging results that were available during my care of the patient were reviewed by me and considered in my medical decision making (see chart for details).  Joseph Maddox was evaluated in Emergency Department on 08/05/2020 for the symptoms described in the history of present illness. He was evaluated in the context of the global COVID-19 pandemic, which necessitated consideration that the patient might be at risk for infection  with the SARS-CoV-2 virus that causes COVID-19. Institutional protocols and algorithms that pertain to the evaluation of patients at risk for COVID-19 are in a state of rapid change based on information released by regulatory bodies including the CDC and federal and state organizations. These policies and algorithms were followed during the patient's care in the ED.   Patient presents with atypical chest discomfort which he specifically would not describe his pain but only a slight pressure. Considering the patient's symptoms, medical history, and physical examination today, I have low suspicion for ACS, PE, TAD, pneumothorax, carditis, mediastinitis, pneumonia, CHF, or sepsis.  EKG nonischemic, chest x-ray unremarkable, labs are normal including serial troponins.  Covid test is negative and patient has been vaccinated.  Suspect presentation and his mildly elevated body temperature related to seasonal allergies/URI.  Recommended he increase Zyrtec to twice daily, follow-up with his PCP for consideration of outpatient TTE if symptoms do not resolve.      ____________________________________________   FINAL CLINICAL IMPRESSION(S) /  ED DIAGNOSES    Final diagnoses:  Nonspecific chest pain     ED Discharge Orders    None      Portions of this note were generated with dragon dictation software. Dictation errors may occur despite best attempts at proofreading.   Sharman Cheek, MD 08/05/20 [phone]

## 2020-08-11 ENCOUNTER — Encounter: Payer: Self-pay | Admitting: Family Medicine

## 2020-08-11 ENCOUNTER — Other Ambulatory Visit: Payer: Self-pay

## 2020-08-11 ENCOUNTER — Ambulatory Visit (INDEPENDENT_AMBULATORY_CARE_PROVIDER_SITE_OTHER): Payer: 59 | Admitting: Family Medicine

## 2020-08-11 VITALS — BP 148/78 | HR 104 | Temp 97.4°F | Ht 66.0 in | Wt 303.1 lb

## 2020-08-11 DIAGNOSIS — R0981 Nasal congestion: Secondary | ICD-10-CM | POA: Diagnosis not present

## 2020-08-11 DIAGNOSIS — Z13220 Encounter for screening for lipoid disorders: Secondary | ICD-10-CM | POA: Insufficient documentation

## 2020-08-11 DIAGNOSIS — F419 Anxiety disorder, unspecified: Secondary | ICD-10-CM | POA: Diagnosis not present

## 2020-08-11 DIAGNOSIS — R7303 Prediabetes: Secondary | ICD-10-CM | POA: Diagnosis not present

## 2020-08-11 DIAGNOSIS — G4733 Obstructive sleep apnea (adult) (pediatric): Secondary | ICD-10-CM | POA: Diagnosis not present

## 2020-08-11 DIAGNOSIS — Z23 Encounter for immunization: Secondary | ICD-10-CM | POA: Diagnosis not present

## 2020-08-11 MED ORDER — BUSPIRONE HCL 15 MG PO TABS: 7.5000 mg | ORAL_TABLET | Freq: Two times a day (BID) | ORAL | 3 refills | Status: DC | PRN

## 2020-08-11 NOTE — Assessment & Plan Note (Signed)
Worse lately -esp during ER visit for chest discomfort and nasal congestion  Doing some better now  Also starting to work on self care Reviewed stressors/ coping techniques/symptoms/ support sources/ tx options and side effects in detail today  Will try buspar (he has taken in the past) prn  Discussed expectations of this medication including time to effectiveness and mechanism of action, also poss of side effects (early and late)- including mental fuzziness, weight or appetite change, nausea and poss of worse dep or anxiety (even suicidal thoughts)  Pt voiced understanding and will stop med and update if this occurs

## 2020-08-11 NOTE — Progress Notes (Signed)
Subjective:    Patient ID: Joseph Maddox, male    DOB: [birth year], 39 y.o.   MRN: [id number]  This visit occurred during the SARS-CoV-2 public health emergency.  Safety protocols were in place, including screening questions prior to the visit, additional usage of staff PPE, and extensive cleaning of exam room while observing appropriate contact time as indicated for disinfecting solutions.    HPI  Pt presents for ER f/u  Wt Readings from Last 3 Encounters:  08/11/20 (!) 303 lb 2 oz (137.5 kg)  08/04/20 (!) 302 lb (137 kg)  11/12/19 (!) 302 lb 1 oz (137 kg)   48.93 kg/m  he was seen on 08/05/20 for chest pain assoc with nasal congestion   EKG showed sinus tach with rate of 128 No acute changes  DG Chest 2 View  Result Date: 08/04/2020 CLINICAL DATA:  Chest pressure. Chest pain and nasal congestion. Shortness of breath. EXAM: CHEST - 2 VIEW COMPARISON:  None. FINDINGS: The cardiomediastinal contours are normal. The lungs are clear. Pulmonary vasculature is normal. No consolidation, pleural effusion, or pneumothorax. No acute osseous abnormalities are seen. IMPRESSION: Negative radiographs of the chest. Electronically Signed   By: Narda Rutherford M.D.   On: 08/04/2020 19:20    Labs were nl incl serial troponins , covid test neg (also vaccinated) Suspected allergic rhinitis  inst to inc zyrtec to bid   Results for orders placed or performed during the hospital encounter of 08/05/20  Respiratory Panel by RT PCR (Flu A&B, Covid) - Nasopharyngeal Swab   Specimen: Nasopharyngeal Swab  Result Value Ref Range   SARS Coronavirus 2 by RT PCR NEGATIVE NEGATIVE   Influenza A by PCR NEGATIVE NEGATIVE   Influenza B by PCR NEGATIVE NEGATIVE  Basic metabolic panel  Result Value Ref Range   Sodium 139 135 - 145 mmol/L   Potassium 4.3 3.5 - 5.1 mmol/L   Chloride 104 98 - 111 mmol/L   CO2 26 22 - 32 mmol/L   Glucose, Bld 120 (H) 70 - 99 mg/dL   BUN 16 6 - 20 mg/dL   Creatinine, Ser 2.95  0.61 - 1.24 mg/dL   Calcium 9.7 8.9 - 28.4 mg/dL   GFR calc non Af Amer >60 >60 mL/min   GFR calc Af Amer >60 >60 mL/min   Anion gap 9 5 - 15  CBC  Result Value Ref Range   WBC 8.9 4.0 - 10.5 K/uL   RBC 5.13 4.22 - 5.81 MIL/uL   Hemoglobin 14.7 13.0 - 17.0 g/dL   HCT 13.2 39 - 52 %   MCV 84.6 80.0 - 100.0 fL   MCH 28.7 26.0 - 34.0 pg   MCHC 33.9 30.0 - 36.0 g/dL   RDW 44.0 10.2 - 72.5 %   Platelets 304 150 - 400 K/uL   nRBC 0.0 0.0 - 0.2 %  Troponin I (High Sensitivity)  Result Value Ref Range   Troponin I (High Sensitivity) 6 <18 ng/L  Troponin I (High Sensitivity)  Result Value Ref Range   Troponin I (High Sensitivity) 8 <18 ng/L     He is a former smoker- for 6 years quit in 2014 Family history of HTN   Lab Results  Component Value Date   CHOL 157 05/25/2012   HDL 46.90 05/25/2012   LDLCALC 80 05/25/2012   TRIG 152.0 (H) 05/25/2012   CHOLHDL 3 05/25/2012   BP Readings from Last 3 Encounters:  08/11/20 (!) 148/78  08/05/20 (!) 165/87  11/12/19  130/85   Pulse Readings from Last 3 Encounters:  08/11/20 (!) 104  08/05/20 93  11/12/19 79   Pt is overall better  He seldom feels the chest pressure any more  With zyrtec bid -seems to have helped   He wears a cpap -was originally waking up gasping for air (his nose was congested and his mask is nose only) No longer doing this -much better  He occ has slight congestion  Nasal saline helps for a few hours  flonase- using am and pm  He has also started sleeping on his side- getting used to it   Has felt keyed up /nervous after going to the ER  It scared him   He just started an eating plan - small meals 6 times per day (since Thursday) Home work out - exercise/ program with squats (90 days)  Patient Active Problem List   Diagnosis Date Noted  . Encounter for screening for lipoid disorders 08/11/2020  . Routine general medical examination at a health care facility 05/09/2018  . Nasal congestion 01/13/2018  .  Benign paroxysmal positional vertigo 01/15/2015  . Post-nasal drip 01/15/2015  . OSA (obstructive sleep apnea) 02/16/2013  . Somnolence, daytime 02/16/2013  . Morbid obesity (HCC) 02/16/2013  . Quit smoking 03/01/2012  . Prediabetes 07/20/2011  . Anxiety 07/06/2011   Past Medical History:  Diagnosis Date  . Allergic rhinitis   . Sleep apnea    History reviewed. No pertinent surgical history. Social History   Tobacco Use  . Smoking status: Former Smoker    Packs/day: 0.75    Years: 6.00    Pack years: 4.50    Types: Cigars    Quit date: 12/11/2012    Years since quitting: 7.6  . Smokeless tobacco: Never Used  Substance Use Topics  . Alcohol use: Yes    Alcohol/week: 0.0 standard drinks    Comment: occassionally  . Drug use: No   Family History  Problem Relation Age of Onset  . Hypertension Other        Fam hx  . Hypertension Mother   . Hypertension Father   . Stroke Maternal Grandmother   . Cancer Maternal Grandfather        prostate CA   No Known Allergies Current Outpatient Medications on File Prior to Visit  Medication Sig Dispense Refill  . cetirizine (ZYRTEC) 10 MG tablet Take 10 mg by mouth daily.    . fluticasone (FLONASE) 50 MCG/ACT nasal spray Place 2 sprays into both nostrils 2 (two) times daily. 32 g 11   No current facility-administered medications on file prior to visit.    Review of Systems  Constitutional: Negative for activity change, appetite change, fatigue, fever and unexpected weight change.  HENT: Positive for congestion and rhinorrhea. Negative for facial swelling, postnasal drip, sinus pressure, sneezing, sore throat, trouble swallowing and voice change.   Eyes: Negative for pain, redness, itching and visual disturbance.  Respiratory: Negative for cough, chest tightness, shortness of breath and wheezing.   Cardiovascular: Negative for chest pain, palpitations and leg swelling.       Chest discomfort is better  Gastrointestinal: Negative for  abdominal pain, blood in stool, constipation, diarrhea and nausea.  Endocrine: Negative for cold intolerance, heat intolerance, polydipsia and polyuria.  Genitourinary: Negative for difficulty urinating, dysuria, frequency and urgency.  Musculoskeletal: Negative for arthralgias, joint swelling and myalgias.  Skin: Negative for pallor and rash.  Neurological: Negative for dizziness, tremors, weakness, numbness and headaches.  Hematological: Negative  for adenopathy. Does not bruise/bleed easily.  Psychiatric/Behavioral: Negative for decreased concentration, dysphoric mood and sleep disturbance. The patient is nervous/anxious.        Objective:   Physical Exam Constitutional:      General: He is not in acute distress.    Appearance: Normal appearance. He is well-developed. He is obese. He is not ill-appearing or diaphoretic.  HENT:     Head: Normocephalic and atraumatic.     Nose: Congestion present.     Comments: Mildly congested boggy nares     Mouth/Throat:     Mouth: Mucous membranes are moist.  Eyes:     General: No scleral icterus.       Right eye: No discharge.        Left eye: No discharge.     Conjunctiva/sclera: Conjunctivae normal.     Pupils: Pupils are equal, round, and reactive to light.  Neck:     Thyroid: No thyromegaly.     Vascular: No carotid bruit or JVD.  Cardiovascular:     Rate and Rhythm: Regular rhythm. Tachycardia present.     Pulses: Normal pulses.     Heart sounds: Normal heart sounds. No gallop.   Pulmonary:     Effort: Pulmonary effort is normal. No respiratory distress.     Breath sounds: Normal breath sounds. No wheezing or rales.  Abdominal:     General: Bowel sounds are normal. There is no distension or abdominal bruit.     Palpations: Abdomen is soft. There is no mass.     Tenderness: There is no abdominal tenderness.  Musculoskeletal:     Cervical back: Normal range of motion and neck supple. No tenderness.     Right lower leg: No edema.      Left lower leg: No edema.  Lymphadenopathy:     Cervical: No cervical adenopathy.  Skin:    General: Skin is warm and dry.     Coloration: Skin is not pale.     Findings: No erythema or rash.  Neurological:     Mental Status: He is alert.     Sensory: No sensory deficit.     Coordination: Coordination normal.     Deep Tendon Reflexes: Reflexes are normal and symmetric. Reflexes normal.  Psychiatric:        Attention and Perception: Attention normal.        Mood and Affect: Mood is anxious.        Thought Content: Thought content normal.        Cognition and Memory: Cognition and memory normal.           Assessment & Plan:   Problem List Items Addressed This Visit      Respiratory   OSA (obstructive sleep apnea)    Important to control nasal congestion due to nose only mask  Doing better with steroid ns and zyrtec bid (ER doctor recommended)        Other   Anxiety    Worse lately -esp during ER visit for chest discomfort and nasal congestion  Doing some better now  Also starting to work on self care Reviewed stressors/ coping techniques/symptoms/ support sources/ tx options and side effects in detail today  Will try buspar (he has taken in the past) prn  Discussed expectations of this medication including time to effectiveness and mechanism of action, also poss of side effects (early and late)- including mental fuzziness, weight or appetite change, nausea and poss of worse dep or anxiety (  even suicidal thoughts)  Pt voiced understanding and will stop med and update if this occurs        Relevant Medications   busPIRone (BUSPAR) 15 MG tablet   Prediabetes    Due for labs  Pt started better diet /exercise program last week      Relevant Orders   Hemoglobin A1c   Morbid obesity (HCC)    Discussed how this problem influences overall health and the risks it imposes  Reviewed plan for weight loss with lower calorie diet (via better food choices and also portion  control or program like weight watchers) and exercise building up to or more than 30 minutes 5 days per week including some aerobic activity   Pt started a program last week to help      Nasal congestion - Primary    Some improvement with bid zyrtec (ER doctor recommended) and flonase ns  This is imp for his cpap Prev chest pressure was assoc with nasal congestion       Encounter for screening for lipoid disorders    Lipid screen today Disc a low sat and trans fat diet      Relevant Orders   Lipid panel    Other Visit Diagnoses    Need for influenza vaccination       Relevant Orders   Flu Vaccine QUAD 6+ mos PF IM (Fluarix Quad PF) (Completed)

## 2020-08-11 NOTE — Assessment & Plan Note (Signed)
Some improvement with bid zyrtec (ER doctor recommended) and flonase ns  This is imp for his cpap Prev chest pressure was assoc with nasal congestion

## 2020-08-11 NOTE — Assessment & Plan Note (Signed)
Due for labs  Pt started better diet /exercise program last week

## 2020-08-11 NOTE — Assessment & Plan Note (Signed)
Discussed how this problem influences overall health and the risks it imposes  Reviewed plan for weight loss with lower calorie diet (via better food choices and also portion control or program like weight watchers) and exercise building up to or more than 30 minutes 5 days per week including some aerobic activity   Pt started a program last week to help

## 2020-08-11 NOTE — Patient Instructions (Addendum)
Try the buspar as needed for anxiety  Exercise and self care will help also   Keep up the good work diet and exercise plan  Make healthy choices   For nasal congestion continue the flonase and zyrtec and nasal saline   Let's check your cholesterol and blood glucose average today   Follow up in 3 months

## 2020-08-11 NOTE — Assessment & Plan Note (Signed)
Lipid screen today Disc a low sat and trans fat diet

## 2020-08-11 NOTE — Assessment & Plan Note (Signed)
Important to control nasal congestion due to nose only mask  Doing better with steroid ns and zyrtec bid (ER doctor recommended)

## 2020-08-12 LAB — HEMOGLOBIN A1C: Hgb A1c MFr Bld: 6.2 % (ref 4.6–6.5)

## 2020-08-12 LAB — LIPID PANEL
Cholesterol: 144 mg/dL (ref 0–200)
HDL: 36.8 mg/dL — ABNORMAL LOW (ref >39.00)
NonHDL: 107.18
Total CHOL/HDL Ratio: 4
Triglycerides: 215 mg/dL — ABNORMAL HIGH (ref 0.0–149.0)
VLDL: 43 mg/dL — ABNORMAL HIGH (ref 0.0–40.0)

## 2020-08-12 LAB — LDL CHOLESTEROL, DIRECT: Direct LDL: 76 mg/dL

## 2020-08-13 ENCOUNTER — Encounter: Payer: Self-pay | Admitting: *Deleted

## 2020-08-21 ENCOUNTER — Telehealth: Payer: Self-pay

## 2020-08-21 NOTE — Telephone Encounter (Signed)
Pt.notified

## 2020-08-21 NOTE — Telephone Encounter (Signed)
I don't think there should be a problem

## 2020-08-21 NOTE — Telephone Encounter (Signed)
Pt wanted to know if there was an issue with Tylenol with his Buspar. I told him I did not think there was, but thought he should check with the pharmacist. I will forward this to Dr tower, also.

## 2020-09-02 ENCOUNTER — Other Ambulatory Visit: Payer: Self-pay | Admitting: Family Medicine

## 2020-09-09 NOTE — Telephone Encounter (Signed)
Pt seen 08-11-20.

## 2020-10-20 ENCOUNTER — Telehealth: Payer: Self-pay | Admitting: Family Medicine

## 2020-10-20 DIAGNOSIS — M549 Dorsalgia, unspecified: Secondary | ICD-10-CM | POA: Insufficient documentation
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
## 2020-10-20 NOTE — Telephone Encounter (Signed)
He wants to go to [facility] in [place] [address]  Ph: [phone]

## 2020-10-20 NOTE — Telephone Encounter (Signed)
Referral done

## 2020-10-21 NOTE — Telephone Encounter (Signed)
Noted  

## 2020-10-22 ENCOUNTER — Ambulatory Visit
Admission: RE | Admit: 2020-10-22 | Discharge: 2020-10-22 | Disposition: A | Discharge status: Home / Self Care | Payer: 59 | Source: Ambulatory Visit | Attending: Chiropractor | Admitting: Chiropractor

## 2020-10-22 ENCOUNTER — Other Ambulatory Visit: Payer: Self-pay | Admitting: Chiropractor

## 2020-10-22 DIAGNOSIS — S134XXA Sprain of ligaments of cervical spine, initial encounter: Secondary | ICD-10-CM

## 2020-10-22 DIAGNOSIS — S233XXA Sprain of ligaments of thoracic spine, initial encounter: Secondary | ICD-10-CM

## 2020-10-22 DIAGNOSIS — S338XXA Sprain of other parts of lumbar spine and pelvis, initial encounter: Secondary | ICD-10-CM

## 2020-11-05 ENCOUNTER — Telehealth: Payer: Self-pay | Admitting: Pulmonary Disease

## 2020-11-05 NOTE — Telephone Encounter (Signed)
Spoke with pt, states that he had an "episode" with his cpap machine last night where it was difficult for him to wear, kept waking him up.  Pt has not been seen since 02/2018.  Pt is in [place].  Scheduled pt for an OV on 11/14/20 for further evaluation.  Nothing further needed at this time- will close encounter.

## 2020-11-11 ENCOUNTER — Ambulatory Visit: Payer: 59 | Admitting: Family Medicine

## 2020-11-14 ENCOUNTER — Ambulatory Visit (INDEPENDENT_AMBULATORY_CARE_PROVIDER_SITE_OTHER): Payer: 59 | Admitting: Pulmonary Disease

## 2020-11-14 ENCOUNTER — Other Ambulatory Visit: Payer: Self-pay

## 2020-11-14 ENCOUNTER — Encounter: Payer: Self-pay | Admitting: Pulmonary Disease

## 2020-11-14 VITALS — BP 130/84 | HR 98 | Temp 97.3°F | Ht 66.0 in | Wt 297.8 lb

## 2020-11-14 DIAGNOSIS — J310 Chronic rhinitis: Secondary | ICD-10-CM | POA: Diagnosis not present

## 2020-11-14 DIAGNOSIS — G4733 Obstructive sleep apnea (adult) (pediatric): Secondary | ICD-10-CM | POA: Diagnosis not present

## 2020-11-14 MED ORDER — CHLORPHENIRAMINE MALEATE 4 MG PO TABS: ORAL_TABLET | ORAL | 2 refills | Status: AC

## 2020-11-14 NOTE — Patient Instructions (Addendum)
You were seen today by Coral Ceo, NP  for:   1. OSA (obstructive sleep apnea)  We recommend that you continue using your CPAP daily >>>Keep up the hard work using your device >>> Goal should be wearing this for the entire night that you are sleeping, at least 4 to 6 hours  Remember:  . Do not drive or operate heavy machinery if tired or drowsy.  . Please notify the supply company and office if you are unable to use your device regularly due to missing supplies or machine being broken.  . Work on maintaining a healthy weight and following your recommended nutrition plan  . Maintain proper daily exercise and movement  . Maintaining proper use of your device can also help improve management of other chronic illnesses such as: Blood pressure, blood sugars, and weight management.   BiPAP/ CPAP Cleaning:  >>>Clean weekly, with Dawn soap, and bottle brush.  Set up to air dry. >>> Wipe mask out daily with wet wipe or towelette   2. Rhinitis, chronic  - chlorpheniramine (CHLOR-TRIMETON) 4 MG tablet; chlorpheniramine (aka Chlor tabs) 4 mg tablet (1 to 2 tablets at night) for management of allergies and postnasal drip at night as needed.This medication is sedating  Dispense: 45 tablet; Refill: 2  Continue Zyrtec in the morning  Continue nasal saline rinses   Continue Flonase use  Please start taking chlorpheniramine (aka Chlor tabs) 4 mg tablet (1 to 2 tablets at night) for management of allergies and postnasal drip at night >>> This is an over-the-counter medication >>> This medication is sedating   3. Morbid obesity (HCC)  Continue to work with primary care on working to reduce your BMI  Working to reduce your BMI can be helpful with management of obstructive sleep apnea   We recommend today:   Meds ordered this encounter  Medications  . chlorpheniramine (CHLOR-TRIMETON) 4 MG tablet    Sig: chlorpheniramine (aka Chlor tabs) 4 mg tablet (1 to 2 tablets at night) for management  of allergies and postnasal drip at night as needed.This medication is sedating    Dispense:  45 tablet    Refill:  2    Follow Up:    Return in about 4 months (around 03/14/2021), or if symptoms worsen or fail to improve, for Follow up with Dr. Craige Cotta.   Notification of test results are managed in the following manner: If there are  any recommendations or changes to the  plan of care discussed in office today,  we will contact you and let you know what they are. If you do not hear from [place], then your results are normal and you can view them through your  MyChart account , or a letter will be sent to you. Thank you again for trusting [place] with your care  - Thank you, [facility]    It is flu season:   >>> Best ways to protect herself from the flu: Receive the yearly flu vaccine, practice good hand hygiene washing with soap and also using hand sanitizer when available, eat a nutritious meals, get adequate rest, hydrate appropriately       Please contact the office if your symptoms worsen or you have concerns that you are not improving.   Thank you for choosing [facility] for your healthcare, and for allowing [place] to partner with you on your healthcare journey. I am thankful to be able to provide care to you today.   Elisha Headland FNP-C

## 2020-11-14 NOTE — Assessment & Plan Note (Signed)
Plan: Continue Zyrtec daily Offered lab work to check eosinophils and IgE, patient declined Continue nasal saline rinses Continue Flonase use Start chlor tabs at night as needed

## 2020-11-14 NOTE — Assessment & Plan Note (Signed)
BMI 48  Plan: Continue work with primary care on management of BMI
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
## 2020-11-14 NOTE — Assessment & Plan Note (Signed)
Plan: Continue follow-up with sleep MD Continue Pap therapy

## 2020-11-18 NOTE — Progress Notes (Signed)
Reviewed and agree with assessment/plan.   Coralyn Helling, MD [facility] Pulmonary/Critical Care 11/18/2020, 8:37 AM Pager:  [phone]

## 2020-12-08 ENCOUNTER — Other Ambulatory Visit: Payer: Self-pay | Admitting: Family Medicine

## 2020-12-14 ENCOUNTER — Other Ambulatory Visit: Payer: Self-pay | Admitting: Family Medicine

## 2021-02-09 ENCOUNTER — Ambulatory Visit: Payer: 59 | Admitting: Family Medicine

## 2021-03-21 ENCOUNTER — Other Ambulatory Visit: Payer: Self-pay | Admitting: Family Medicine

## 2021-05-07 ENCOUNTER — Ambulatory Visit: Payer: 59 | Admitting: Family Medicine

## 2021-05-22 ENCOUNTER — Telehealth: Payer: Self-pay

## 2021-05-22 NOTE — Telephone Encounter (Signed)
It doesn't sound like he needs treatment even if he has COVID. I would recommend testing so he can isolate if he is positive If negative, he should still wear a mask around other people just to be safe. Can continue his allergy meds as it might just be that

## 2021-05-22 NOTE — Telephone Encounter (Signed)
Patient called stating he was around his aunt on Sunday 05/17/21 and she ended up been tested positive for COVID on 05/19/21. Patient states he has allergies and some days they flare up. He is not sure if his current symptoms are from Allergies or possible COVID. Monday started having a headache off and on, yesterday started to feel more sinus pressure, itchy throat. No fever. He uses Flonase and Zyrtec.  We have no appointments here or another location. Please advise on how to proceed.

## 2021-05-22 NOTE — Telephone Encounter (Signed)
Spoke to pt. Gave him information on [facility] for PCR testing.

## 2021-05-25 ENCOUNTER — Ambulatory Visit
Admission: RE | Admit: 2021-05-25 | Discharge: 2021-05-25 | Disposition: A | Discharge status: Home / Self Care | Payer: 59 | Attending: Family Medicine | Admitting: Family Medicine

## 2021-05-25 ENCOUNTER — Ambulatory Visit
Admission: RE | Admit: 2021-05-25 | Discharge: 2021-05-25 | Disposition: A | Discharge status: Home / Self Care | Payer: 59 | Source: Ambulatory Visit | Attending: Family Medicine | Admitting: Family Medicine

## 2021-05-25 ENCOUNTER — Telehealth: Payer: Self-pay | Admitting: *Deleted

## 2021-05-25 DIAGNOSIS — R059 Cough, unspecified: Secondary | ICD-10-CM | POA: Diagnosis not present

## 2021-05-25 MED ORDER — HYDROCODONE BIT-HOMATROP MBR 5-1.5 MG/5ML PO SOLN: 5.0000 mL | Freq: Three times a day (TID) | ORAL | 0 refills | Status: DC | PRN

## 2021-05-25 NOTE — Telephone Encounter (Signed)
Pt notified of Dr. Royden Purl instructions and recommendations and pt verbalized understanding. Pt will use med and go get Xray, caution sedation given and ER precautions given

## 2021-05-25 NOTE — Telephone Encounter (Signed)
He needs a cxr  I placed an order for [facility] but if he would rather return to [facility] it is up to him  I sent hycodan to pharmacy for cough and ST  Caution of sedation  Has hydrocodone so keep under lock and key  If severe symptoms or sob go to ER  I am out of the office today and will be off computer for the remainder

## 2021-05-25 NOTE — Telephone Encounter (Signed)
[facility] went to [facility] ([facility]) and was diagnosed with covid. Pt was given paxlovid and tessalon. Pt said that the tessalon isn't helping cough and it's so sever he has coughed up blood tinged phlegm a few times. Pt said due to the sever cough he has also developed a sever ST. Pt said he has been taking ibuprofen and tylenol and it's not helping his sore throat. Pt state his cough is still bad so it's making his throat hurt worse. Pt wanted to know if PCP can send something in to help his sore throat, and if something stronger could be sent to help his cough. Pt said that he did take night quil last night for the 1st time and it did help his throat pain a little enough for him to get some sleep.  [facility]

## 2021-05-26 ENCOUNTER — Telehealth: Payer: Self-pay

## 2021-05-26 NOTE — Telephone Encounter (Signed)
It is ok to take hycodan with buspar

## 2021-05-26 NOTE — Telephone Encounter (Signed)
[facility] Night - Client TELEPHONE ADVICE RECORD [facility] Patient Name: Joseph Maddox Gender: Male DOB: 11-28-1980 Age: 40 Y 1 M 6 D Return Phone Number: [phone] (Primary) Address: [address] City/ State/ Zip: [place] [address] Client [facility] Night - Client Client Site [facility] [place] - Night Physician Tower, Idamae Schuller - MD Contact Type Call Who Is Calling Patient / Member / Family / Caregiver Call Type Triage / Clinical Relationship To Patient Self Return Phone Number [phone] (Primary) Chief Complaint Cough Reason for Call Symptomatic / Request for Health Information Initial Comment Caller got prescribed hydrocodone syrup. He takes buspar at night. Can he take both. Translation No Nurse Assessment Nurse: Colon, RN, Clotilde Dieter Date/Time (Eastern Time): 05/25/2021 9:57:54 PM Confirm and document reason for call. If symptomatic, describe symptoms. ---Caller states he got prescribed hydrocodone/ homatropine solution cough syrup. He takes buspar at night. Asking if he can he take both. He also takes Flonase and Chlorpheniramine maleate as well as Zyrtec. He is taking Paxlovid and alternating Tylenol and Ibuprofen per MD recommendation. Does the patient have any new or worsening symptoms? ---No Please document clinical information provided and list any resource used. ---Caller wanting to know if he can take both buspar and cough syrup together. Advised per Medscape interaction checker, taking medication together increases risk of respiratory depression and sedation. Advised to take cough syrup tomorrow. No interaction noted with Zyrtec which he takes in AM or tylenol and ibuprofen. Caller understood. Disp. Time Lamount Cohen Time) Disposition Final User 05/25/2021 10:14:55 PM Clinical Call Yes Colon, RN, Clotilde Dieter

## 2021-05-26 NOTE — Telephone Encounter (Signed)
Pt notified of Dr. Tower's comments  

## 2021-05-27 ENCOUNTER — Telehealth: Payer: Self-pay | Admitting: Family Medicine

## 2021-05-27 NOTE — Telephone Encounter (Signed)
If he can tolerate the symptoms I recommend he finish it. If not, go ahead and stop it. Keep [place] posted

## 2021-05-27 NOTE — Telephone Encounter (Signed)
Pt notified of Dr. Royden Purl comments. He said he thinks he is going to stop med because he is already starting to feel better and diarrhea and GERD sxs are pretty bad. Pt advise to keep [place] posted if sxs worsen or he is concerned about any new sxs

## 2021-05-27 NOTE — Telephone Encounter (Signed)
Pt called. [facility] put him on paxlovid. He is wanting to know if he can stop this mid course since it is giving him diarrhea and a bad taste in his mouth

## 2021-08-24 ENCOUNTER — Ambulatory Visit: Payer: 59 | Admitting: Family Medicine

## 2021-09-09 ENCOUNTER — Other Ambulatory Visit: Payer: Self-pay | Admitting: Family Medicine

## 2021-09-09 NOTE — Telephone Encounter (Signed)
Pt hasn't been seen in over a year please schedule a f/u or Cpe (pt's choice) and then send back to me to refill once appt has been made.

## 2021-09-09 NOTE — Telephone Encounter (Signed)
Pt have a 6 month f/u apt. On 1.17.2023

## 2021-11-24 ENCOUNTER — Ambulatory Visit: Payer: 59 | Admitting: Family Medicine

## 2021-12-05 ENCOUNTER — Other Ambulatory Visit: Payer: Self-pay | Admitting: Family Medicine

## 2022-02-24 ENCOUNTER — Encounter: Payer: Self-pay | Admitting: Family Medicine

## 2022-02-24 ENCOUNTER — Ambulatory Visit (INDEPENDENT_AMBULATORY_CARE_PROVIDER_SITE_OTHER): Payer: 59 | Admitting: Family Medicine

## 2022-02-24 VITALS — BP 126/78 | HR 105 | Temp 97.3°F | Ht 65.5 in | Wt 274.0 lb

## 2022-02-24 DIAGNOSIS — R7303 Prediabetes: Secondary | ICD-10-CM

## 2022-02-24 DIAGNOSIS — Z13220 Encounter for screening for lipoid disorders: Secondary | ICD-10-CM | POA: Insufficient documentation

## 2022-02-24 DIAGNOSIS — J310 Chronic rhinitis: Secondary | ICD-10-CM

## 2022-02-24 DIAGNOSIS — F419 Anxiety disorder, unspecified: Secondary | ICD-10-CM

## 2022-02-24 DIAGNOSIS — G4733 Obstructive sleep apnea (adult) (pediatric): Secondary | ICD-10-CM

## 2022-02-24 LAB — LIPID PANEL
Cholesterol: 182 mg/dL (ref 0–200)
HDL: 43.1 mg/dL
LDL Cholesterol: 124 mg/dL — ABNORMAL HIGH (ref 0–99)
NonHDL: 139.3
Total CHOL/HDL Ratio: 4
Triglycerides: 75 mg/dL (ref 0.0–149.0)
VLDL: 15 mg/dL (ref 0.0–40.0)

## 2022-02-24 LAB — BASIC METABOLIC PANEL
BUN: 16 mg/dL (ref 6–23)
CO2: 28 mEq/L (ref 19–32)
Calcium: 9.7 mg/dL (ref 8.4–10.5)
Chloride: 100 mEq/L (ref 96–112)
Creatinine, Ser: 1.13 mg/dL (ref 0.40–1.50)
GFR: 80.54 mL/min (ref >60.00)
Glucose, Bld: 102 mg/dL — ABNORMAL HIGH (ref 70–99)
Potassium: 4.6 mEq/L (ref 3.5–5.1)
Sodium: 136 mEq/L (ref 135–145)

## 2022-02-24 LAB — HEMOGLOBIN A1C: Hgb A1c MFr Bld: 5.8 % (ref 4.6–6.5)

## 2022-02-24 MED ORDER — FLUTICASONE PROPIONATE 50 MCG/ACT NA SUSP: 2.0000 | Freq: Two times a day (BID) | NASAL | 3 refills | Status: DC

## 2022-02-24 NOTE — Assessment & Plan Note (Signed)
Lipid panel today  ?Disc goals for lipids and reasons to control them ?Rev last labs with pt ?Rev low sat fat diet in detail ?Diet is improved  ?

## 2022-02-24 NOTE — Assessment & Plan Note (Signed)
Discussed how this problem influences overall health and the risks it imposes  °Reviewed plan for weight loss with lower calorie diet (via better food choices and also portion control or program like weight watchers) and exercise building up to or more than 30 minutes 5 days per week including some aerobic activity  ° °Commended wt loss so far  °

## 2022-02-24 NOTE — Assessment & Plan Note (Signed)
Doing very well and stress is lower ?Has buspar to take 7.5 mg bid/often just needs it at night  ?May try coming off of it soon ?Reviewed stressors/ coping techniques/symptoms/ support sources/ tx options and side effects in detail today ?Better self care incl diet/exercise and wt loss ?Commended  ?

## 2022-02-24 NOTE — Assessment & Plan Note (Signed)
Doing better with daily flonase and also saline rinse  ?

## 2022-02-24 NOTE — Assessment & Plan Note (Signed)
a1c ordered ?disc imp of low glycemic diet and wt loss to prevent DM2  ? ?Doing better and has lost wt ?

## 2022-02-24 NOTE — Assessment & Plan Note (Signed)
Doing well with cpap ?Quality of sleep is better and he feels good  ?

## 2022-02-24 NOTE — Progress Notes (Signed)
? ?Subjective:  ? ? Patient ID: Joseph Maddox, male    DOB: 10/12/1981, 41 y.o.   MRN: [id number] ? ?HPI ?Pt presents for f/u of anxiety and chronic health problems  ? ?Wt Readings from Last 3 Encounters:  ?02/24/22 274 lb (124.3 kg)  ?11/14/20 297 lb 12.8 oz (135.1 kg)  ?08/11/20 (!) 303 lb 2 oz (137.5 kg)  ? ?44.90 kg/m? ? ?Working and doing well  ? ?Loosing weight intentionally  ?Avoiding sugar and soda since January / this is going well  ? ?Exercise-just started this week  ?Push ups/sit ups / floor exercise  ?Could add cardio  ? ? ?Anxiety  ?Taking buspar 7.5 mg bid  ?It helps  ?Takes 1/2 pill before he goes to sleep / may try to wean  ?Had tried to stop it and mind raced  ? ?Stress level is low right now  ? ? ?OSA sees pulmonary/has cpap  ?Doing ok -uses every night  ?Some nights he sleeps better /quality of sleep is better  ? ? ?Prediabetes ?Lab Results  ?Component Value Date  ? HGBA1C 6.2 08/11/2020  ? ?Is open to checking it again  ?Also would like to check cholesterol ? ?BP Readings from Last 3 Encounters:  ?02/24/22 126/78  ?11/14/20 130/84  ?08/11/20 (!) 148/78  ? ?Pulse Readings from Last 3 Encounters:  ?02/24/22 (!) 105  ?11/14/20 98  ?08/11/20 (!) 104  ? ? ? ?Chronic rhinitis  ?Nasal rinse helps his congestion  ?Zyrtec ?Chlor timetron ?Flonase  ? ?Patient Active Problem List  ? Diagnosis Date Noted  ? Lipid screening 02/24/2022  ? Rhinitis, chronic 11/14/2020  ? Encounter for screening for lipoid disorders 08/11/2020  ? Routine general medical examination at a health care facility 05/09/2018  ? Nasal congestion 01/13/2018  ? Post-nasal drip 01/15/2015  ? OSA (obstructive sleep apnea) 02/16/2013  ? Morbid obesity (HCC) 02/16/2013  ? Quit smoking 03/01/2012  ? Prediabetes 07/20/2011  ? Anxiety 07/06/2011  ? ?Past Medical History:  ?Diagnosis Date  ? Allergic rhinitis   ? Sleep apnea   ? ?History reviewed. No pertinent surgical history. ?Social History  ? ?Tobacco Use  ? Smoking status: Former  ?   Packs/day: 0.75  ?  Years: 6.00  ?  Pack years: 4.50  ?  Types: Cigars, Cigarettes  ?  Quit date: 12/11/2012  ?  Years since quitting: 9.2  ? Smokeless tobacco: Never  ?Substance Use Topics  ? Alcohol use: Yes  ?  Alcohol/week: 0.0 standard drinks  ?  Comment: occassionally  ? Drug use: No  ? ?Family History  ?Problem Relation Age of Onset  ? Hypertension Other   ?     Fam hx  ? Hypertension Mother   ? Hypertension Father   ? Stroke Maternal Grandmother   ? Cancer Maternal Grandfather   ?     prostate CA  ? ?No Known Allergies ?Current Outpatient Medications on File Prior to Visit  ?Medication Sig Dispense Refill  ? busPIRone (BUSPAR) 15 MG tablet TAKE 1/2 TABLET BY MOUTH TWICE DAILY AS NEEDED 90 tablet 0  ? cetirizine (ZYRTEC) 10 MG tablet Take 10 mg by mouth daily.    ? chlorpheniramine (CHLOR-TRIMETON) 4 MG tablet chlorpheniramine (aka Chlor tabs) 4 mg tablet (1 to 2 tablets at night) for management of allergies and postnasal drip at night as needed.This medication is sedating 45 tablet 2  ? ?No current facility-administered medications on file prior to visit.  ?  ? ?Review of Systems  ?  Constitutional:  Negative for activity change, appetite change, fatigue, fever and unexpected weight change.  ?HENT:  Negative for congestion, rhinorrhea, sore throat and trouble swallowing.   ?Eyes:  Negative for pain, redness, itching and visual disturbance.  ?Respiratory:  Negative for cough, chest tightness, shortness of breath and wheezing.   ?Cardiovascular:  Negative for chest pain and palpitations.  ?Gastrointestinal:  Negative for abdominal pain, blood in stool, constipation, diarrhea and nausea.  ?Endocrine: Negative for cold intolerance, heat intolerance, polydipsia and polyuria.  ?Genitourinary:  Negative for difficulty urinating, dysuria, frequency and urgency.  ?Musculoskeletal:  Negative for arthralgias, joint swelling and myalgias.  ?Skin:  Negative for pallor and rash.  ?Neurological:  Negative for dizziness,  tremors, weakness, numbness and headaches.  ?Hematological:  Negative for adenopathy. Does not bruise/bleed easily.  ?Psychiatric/Behavioral:  Negative for decreased concentration and dysphoric mood. The patient is not nervous/anxious.   ?     Mood is better   ? ?   ?Objective:  ? Physical Exam ?Constitutional:   ?   General: He is not in acute distress. ?   Appearance: Normal appearance. He is well-developed. He is obese. He is not ill-appearing or diaphoretic.  ?HENT:  ?   Head: Normocephalic and atraumatic.  ?   Nose: No congestion.  ?Eyes:  ?   Conjunctiva/sclera: Conjunctivae normal.  ?   Pupils: Pupils are equal, round, and reactive to light.  ?Neck:  ?   Thyroid: No thyromegaly.  ?   Vascular: No carotid bruit or JVD.  ?Cardiovascular:  ?   Rate and Rhythm: Normal rate and regular rhythm.  ?   Heart sounds: Normal heart sounds.  ?  No gallop.  ?Pulmonary:  ?   Effort: Pulmonary effort is normal. No respiratory distress.  ?   Breath sounds: Normal breath sounds. No wheezing or rales.  ?Abdominal:  ?   General: There is no distension or abdominal bruit.  ?   Palpations: Abdomen is soft.  ?Musculoskeletal:  ?   Cervical back: Normal range of motion and neck supple.  ?   Right lower leg: No edema.  ?   Left lower leg: No edema.  ?Lymphadenopathy:  ?   Cervical: No cervical adenopathy.  ?Skin: ?   General: Skin is warm and dry.  ?   Coloration: Skin is not pale.  ?   Findings: No rash.  ?Neurological:  ?   Mental Status: He is alert.  ?   Coordination: Coordination normal.  ?   Deep Tendon Reflexes: Reflexes are normal and symmetric. Reflexes normal.  ?Psychiatric:     ?   Mood and Affect: Mood normal.  ? ? ? ? ? ?   ?Assessment & Plan:  ? ?Problem List Items Addressed This Visit   ? ?  ? Respiratory  ? OSA (obstructive sleep apnea)  ?  Doing well with cpap ?Quality of sleep is better and he feels good  ? ?  ?  ? Rhinitis, chronic  ?  Doing better with daily flonase and also saline rinse  ? ?  ?  ?  ? Other  ?  Anxiety - Primary  ?  Doing very well and stress is lower ?Has buspar to take 7.5 mg bid/often just needs it at night  ?May try coming off of it soon ?Reviewed stressors/ coping techniques/symptoms/ support sources/ tx options and side effects in detail today ?Better self care incl diet/exercise and wt loss ?Commended  ? ?  ?  ?  Lipid screening  ?  Lipid panel today  ?Disc goals for lipids and reasons to control them ?Rev last labs with pt ?Rev low sat fat diet in detail ?Diet is improved  ?  ?  ? Relevant Orders  ? Lipid panel (Completed)  ? Morbid obesity (HCC)  ?  Discussed how this problem influences overall health and the risks it imposes  ?Reviewed plan for weight loss with lower calorie diet (via better food choices and also portion control or program like weight watchers) and exercise building up to or more than 30 minutes 5 days per week including some aerobic activity  ? ?Commended wt loss so far  ? ?  ?  ? Prediabetes  ?  a1c ordered ?disc imp of low glycemic diet and wt loss to prevent DM2  ? ?Doing better and has lost wt ? ?  ?  ? Relevant Orders  ? Hemoglobin A1c (Completed)  ? Basic metabolic panel (Completed)  ? ? ? ?

## 2022-02-24 NOTE — Patient Instructions (Addendum)
Think about adding some exercise for mood / metabolism and energy  ? ?You can reduce the buspar as you feel you need to  ? ?Take care of yourself  ?Lab today cholesterol and sugar  ? ? ? ?

## 2022-02-25 ENCOUNTER — Encounter: Payer: Self-pay | Admitting: *Deleted

## 2022-03-27 ENCOUNTER — Other Ambulatory Visit: Payer: Self-pay | Admitting: Family Medicine

## 2022-04-14 ENCOUNTER — Other Ambulatory Visit: Payer: Self-pay

## 2022-04-14 ENCOUNTER — Emergency Department
Admission: EM | Admit: 2022-04-14 | Discharge: 2022-04-14 | Disposition: A | Discharge status: Home / Self Care | Payer: Commercial Managed Care - HMO | Attending: Emergency Medicine | Admitting: Emergency Medicine

## 2022-04-14 ENCOUNTER — Ambulatory Visit: Payer: Self-pay

## 2022-04-14 ENCOUNTER — Emergency Department: Payer: Commercial Managed Care - HMO

## 2022-04-14 ENCOUNTER — Telehealth: Payer: Self-pay

## 2022-04-14 DIAGNOSIS — E119 Type 2 diabetes mellitus without complications: Secondary | ICD-10-CM | POA: Diagnosis not present

## 2022-04-14 DIAGNOSIS — M62838 Other muscle spasm: Secondary | ICD-10-CM

## 2022-04-14 DIAGNOSIS — Y92009 Unspecified place in unspecified non-institutional (private) residence as the place of occurrence of the external cause: Secondary | ICD-10-CM | POA: Diagnosis not present

## 2022-04-14 DIAGNOSIS — W010XXA Fall on same level from slipping, tripping and stumbling without subsequent striking against object, initial encounter: Secondary | ICD-10-CM | POA: Insufficient documentation

## 2022-04-14 DIAGNOSIS — S199XXA Unspecified injury of neck, initial encounter: Secondary | ICD-10-CM | POA: Diagnosis present

## 2022-04-14 DIAGNOSIS — S161XXA Strain of muscle, fascia and tendon at neck level, initial encounter: Secondary | ICD-10-CM | POA: Diagnosis not present

## 2022-04-14 MED ORDER — IBUPROFEN 800 MG PO TABS
800.0000 mg | ORAL_TABLET | Freq: Three times a day (TID) | ORAL | 0 refills | Status: DC | PRN
Start: 2022-04-14 — End: 2023-04-14

## 2022-04-14 MED ORDER — CYCLOBENZAPRINE HCL 5 MG PO TABS: 5.0000 mg | ORAL_TABLET | Freq: Three times a day (TID) | ORAL | 0 refills | Status: DC | PRN

## 2022-04-14 NOTE — ED Triage Notes (Addendum)
Pt comes with c/o neck pain after falling at home. Pt states he tripped over his dog. Pt states no pain. Pt states it feels heavy when he goes to lay down and tries to pick it back up.   Pt able to turn head from side to side and tilt front to back.

## 2022-04-14 NOTE — Telephone Encounter (Signed)
Patient calling in stating that he fell yesterday in the yard, didn't think much of it but woke up and all day has felt off. When laying down to sitting up, patient felt like needed assistance due to his head feeling ike it weighed a ton. Joseph Maddox is also having neck pain to which I advised patient that I would have him speak with a triage nurse. Access nurse called back, and patient declined ED outcome.

## 2022-04-14 NOTE — ED Provider Notes (Signed)
[facility] Emergency Department Provider Note     Event Date/Time   First MD Initiated Contact with Patient 04/14/22 2008     (approximate)   History   Neck Pain   HPI  Joseph Maddox is a 41 y.o. male with a history of anxiety, diabetes, OSA, and obesity, presents to the ED, mechanical fall.  Patient reports tripping at home where he tripped over his dog but denies any outright head injury.  He denies any outright head pain, neck pain, or LOC.  He reports a heavy stiff feeling in his neck that is aggravated by attempts to rotate the neck.  He denies any distal paresthesias, grip changes, or chest pain.  He presents to the ED for evaluation of the symptoms.  Physical Exam   Triage Vital Signs: ED Triage Vitals  Enc Vitals Group     BP 04/14/22 1742 (!) 156/94     Pulse Rate 04/14/22 1742 (!) 116     Resp 04/14/22 1742 18     Temp 04/14/22 1742 99.9 F (37.7 C)     Temp src --      SpO2 04/14/22 1742 100 %     Weight --      Height --      Head Circumference --      Peak Flow --      Pain Score 04/14/22 1741 0     Pain Loc --      Pain Edu? --      Excl. in GC? --     Most recent vital signs: Vitals:   04/14/22 1742  BP: (!) 156/94  Pulse: (!) 116  Resp: 18  Temp: 99.9 F (37.7 C)  SpO2: 100%    General Awake, no distress. NAD HEENT NCAT. PERRL. EOMI. Neck supple with normal painless ROM. No rhinorrhea. Mucous membranes are moist.  CV:  Good peripheral perfusion.  RESP:  Normal effort.  ABD:  No distention.  MSK:  Normal spinal alignment without midline tenderness, spasm, vomiting, or step-off.  Normal composite fist distally.  Full active range of motion of the upper and lower extremities.   ED Results / Procedures / Treatments   Labs (all labs ordered are listed, but only abnormal results are displayed) Labs Reviewed - No data to display   EKG    RADIOLOGY  I personally viewed and evaluated these images as part of my  medical decision making, as well as reviewing the written report by the radiologist.  ED Provider Interpretation: no acute findings}  DG Cervical Spine Complete  Result Date: 04/14/2022 CLINICAL DATA:  Fall and neck pain. EXAM: CERVICAL SPINE - COMPLETE 4+ VIEW COMPARISON:  Cervical spine radiograph dated 10/22/2020. FINDINGS: There is no acute fracture or subluxation of the cervical spine. The vertebral body heights and disc spaces are maintained. The visualized posterior elements and the odontoid appear intact. There is anatomic alignment of the lateral masses of C1 and C2. The soft tissues are unremarkable. IMPRESSION: No acute/traumatic cervical spine pathology. Electronically Signed   By: Elgie Collard M.D.   On: 04/14/2022 19:52     PROCEDURES:  Critical Care performed: No  Procedures   MEDICATIONS ORDERED IN ED: Medications - No data to display   IMPRESSION / MDM / ASSESSMENT AND PLAN / ED COURSE  I reviewed the triage vital signs and the nursing notes.  Differential diagnosis includes, but is not limited to, cervical strain, cervical fracture, torticollis, radiculopathy  Patient's presentation is most consistent with acute complicated illness / injury requiring diagnostic workup.  Patient's diagnosis is consistent with cervical strain and myalgias. Patient will be discharged home with prescriptions for ibuprofen and cyclobenzaprine. Patient is to follow up with his PCP as needed or otherwise directed. Patient is given ED precautions to return to the ED for any worsening or new symptoms.     FINAL CLINICAL IMPRESSION(S) / ED DIAGNOSES   Final diagnoses:  Acute strain of neck muscle, initial encounter  Muscle spasms of neck     Rx / DC Orders   ED Discharge Orders          Ordered    ibuprofen (ADVIL) 800 MG tablet  Every 8 hours PRN        04/14/22 2042    cyclobenzaprine (FLEXERIL) 5 MG tablet  3 times daily PRN        04/14/22  2042             Note:  This document was prepared using Dragon voice recognition software and may include unintentional dictation errors.    Lissa Hoard, PA-C 04/14/22 2044    Shaune Pollack, MD 04/15/22 1144

## 2022-04-14 NOTE — Discharge Instructions (Addendum)
Your exam and XR are normal and reassuring at this time. Take the prescription meds as directed. Follow-up with your provider for continued symptoms.

## 2022-04-14 NOTE — Telephone Encounter (Signed)
Agree with UC advisement  I will watch for correspondence

## 2022-04-14 NOTE — Telephone Encounter (Addendum)
Dollarhite, Lauren   LD    3:42 PM Note   Patient calling in stating that he fell yesterday in the yard, didn't think much of it but woke up and all day has felt off. When laying down to sitting up, patient felt like needed assistance due to his head feeling ike it weighed a ton. Viggo is also having neck pain to which I advised patient that I would have him speak with a triage nurse. Access nurse called back, and patient declined ED outcome.        I spoke with pt; pt said on 04/13/22 at 12noon pt was outside and his large dog ran into his leg and pt fell backwards. Pt is not sure if he hit his head or not but pt did not lose consciousness.on 04/13/22 pt head felt heavy and his neck does not "feel right". Pt said his neck feels strained when he goes to lay down or sit up and pt feels like he has to hold to his neck to support it when his is getting up or laying down. Pt can move his neck. Pt said has not had h/a, no dizziness or lightheadedness, no nausea and vomiting, no vision changes, no CP or SOB. Pt does not have a way to ck BP. Pt said he slept all night but when he got up this morning head still feels heavy and his neck is not right. Pt said his neck feels like it is strained and neck feels weak. Will add access note when available. Pt still refuses to go to ED but no available appts at [facility] this afternoon and pt agreed to go to UC. I scheduled pt an appt at [facility] 04/14/22 at 5:45. Pt voiced understanding and appreciative and sending note to Dr Milinda Antis and [place] CMA.   [facility] - Client TELEPHONE ADVICE RECORD AccessNurse Patient Name: Joseph Maddox Gender: Male DOB: [birth year] Age: 41 Y 11 M 25 D Return Phone Number: [phone] (Primary) Address: [address] City/ State/ Zip: [place] [address] Client [facility] - Client Client Site  [facility] - Day Provider Milinda Antis, Idamae Schuller -  MD Contact Type Call Who Is Calling Patient / Member / Family / Caregiver Call Type Triage / Clinical Relationship To Patient Self Return Phone Number [phone] (Primary) Chief Complaint HEAD INJURY - and not acting right. Change in behaviour after hitting head. Reason for Call Symptomatic / Request for Health Information Initial Comment Caller states that he fell yesterday outside and now today when he is laying down and gets up, his head feels very heavy and he is also having neck pain. Translation No Nurse Assessment Nurse: Elesa Hacker, RN, Nash Dimmer Date/Time Lamount Cohen Time): 04/14/2022 3:18:43 PM Confirm and document reason for call. If symptomatic, describe symptoms. ---Caller states that he fell yesterday outside and now today when he is laying down and gets up, his head feels very heavy and he is also having neck pain. Caller advised that his dog clipped his legs and he fell. Does the patient have any new or worsening symptoms? ---Yes Will a triage be completed? ---Yes Related visit to physician within the last 2 weeks? ---No Does the PT have any chronic conditions? (i.e. diabetes, asthma, this includes High risk factors for pregnancy, etc.) ---No Is this a behavioral health or substance abuse call? ---No Guidelines Guideline Title Affirmed Question Affirmed Notes Nurse Date/Time (Eastern Time) Neck Injury [1] Dangerous  mechanism of injury (e.g., MVA, contact sports, diving, fall on trampoline, fall > 10 feet or 3 meters) AND [2] neck pain or stiffness began > 1 hour after injury Deaton, RN, Nash Dimmer 04/14/2022 3:20:56 PM PLEASE NOTE: All timestamps contained within this report are represented as [place] Standard Time. CONFIDENTIALTY NOTICE: This fax transmission is intended only for the addressee. It contains information that is legally privileged, confidential or otherwise protected from use or disclosure. If you are not the intended recipient, you are strictly prohibited  from reviewing, disclosing, copying using or disseminating any of this information or taking any action in reliance on or regarding this information. If you have received this fax in error, please notify us immediately by telephone so that we can arrange for its return to [place]. Phone: [phone], Toll-Free: [phone], Fax: [phone] Page: 2 of 2 Call Id: [id number] Disp. Time Lamount Cohen Time) Disposition Final User 04/14/2022 3:18:00 PM Send to Urgent [place] Agent, Amy 04/14/2022 3:29:46 PM Go to ED Now Yes Deaton, RN, Cory Roughen Disagree/Comply Disagree Caller Understands No PreDisposition Did not know what to do Care Advice Given Per Guideline GO TO ED NOW: * You need to be seen in the Emergency Department. * Go to the ED at ___________ Hospital. * Leave now. Drive carefully. ANOTHER ADULT SHOULD DRIVE: * It is better and safer if another adult drives instead of you. PROTECT THE NECK: * Protect the neck while driving in. * Avoid excessive movement. CARE ADVICE given per Neck Injury (Adult) guideline. Comments User: Wandra Scot, RN Date/Time Lamount Cohen Time): 04/14/2022 3:30:46 PM Caller refused to go to the Ed, called the backline, spoke with Duwayne Heck, she will talk with provider and call patient back. Caller made aware. Referrals GO TO FACILITY REFUSE

## 2022-04-15 NOTE — Telephone Encounter (Signed)
He was treated for neck strain  Note reviewed

## 2023-01-04 ENCOUNTER — Other Ambulatory Visit: Payer: Self-pay | Admitting: Family Medicine

## 2023-03-09 ENCOUNTER — Other Ambulatory Visit: Payer: Self-pay | Admitting: Family Medicine

## 2023-03-09 NOTE — Telephone Encounter (Signed)
Pt is due for his yearly f/u, please schedule and then route back to me to refill

## 2023-03-09 NOTE — Telephone Encounter (Signed)
Called to sched, patient requested to call back later to make appt

## 2023-03-10 NOTE — Telephone Encounter (Signed)
LVM for patient to call back and schedule

## 2023-03-15 NOTE — Telephone Encounter (Signed)
Contacted patient 3 times and sent MyChart message but no response.

## 2023-03-25 ENCOUNTER — Encounter: Payer: Self-pay | Admitting: Family Medicine

## 2023-03-25 MED ORDER — FLUTICASONE PROPIONATE 50 MCG/ACT NA SUSP: 2.0000 | Freq: Two times a day (BID) | NASAL | 0 refills | Status: DC

## 2023-03-25 NOTE — Telephone Encounter (Signed)
Patient has been scheduled

## 2023-03-25 NOTE — Addendum Note (Signed)
Addended by: Shon Millet on: 03/25/2023 10:08 AM   Modules accepted: Orders

## 2023-03-25 NOTE — Telephone Encounter (Signed)
Med refilled.

## 2023-03-27 ENCOUNTER — Emergency Department: Payer: 59

## 2023-03-27 ENCOUNTER — Other Ambulatory Visit: Payer: Self-pay

## 2023-03-27 ENCOUNTER — Emergency Department
Admission: EM | Admit: 2023-03-27 | Discharge: 2023-03-27 | Discharge status: Against Medical Advice | Payer: 59 | Attending: Emergency Medicine | Admitting: Emergency Medicine

## 2023-03-27 DIAGNOSIS — I10 Essential (primary) hypertension: Secondary | ICD-10-CM | POA: Insufficient documentation

## 2023-03-27 DIAGNOSIS — Z5321 Procedure and treatment not carried out due to patient leaving prior to being seen by health care provider: Secondary | ICD-10-CM | POA: Insufficient documentation

## 2023-03-27 DIAGNOSIS — R519 Headache, unspecified: Secondary | ICD-10-CM | POA: Insufficient documentation

## 2023-03-27 LAB — CBC
HCT: 46 % (ref 39.0–52.0)
Hemoglobin: 15.1 g/dL (ref 13.0–17.0)
MCH: 27.7 pg (ref 26.0–34.0)
MCHC: 32.8 g/dL (ref 30.0–36.0)
MCV: 84.4 fL (ref 80.0–100.0)
Platelets: 285 10*3/uL (ref 150–400)
RBC: 5.45 MIL/uL (ref 4.22–5.81)
RDW: 12.9 % (ref 11.5–15.5)
WBC: 5.2 10*3/uL (ref 4.0–10.5)
nRBC: 0 % (ref 0.0–0.2)

## 2023-03-27 LAB — BASIC METABOLIC PANEL
Anion gap: 11 (ref 5–15)
BUN: 15 mg/dL (ref 6–20)
CO2: 23 mmol/L (ref 22–32)
Calcium: 9.3 mg/dL (ref 8.9–10.3)
Chloride: 101 mmol/L (ref 98–111)
Creatinine, Ser: 0.97 mg/dL (ref 0.61–1.24)
GFR, Estimated: >60 mL/min (ref >60)
Glucose, Bld: 118 mg/dL — ABNORMAL HIGH (ref 70–99)
Potassium: 4.1 mmol/L (ref 3.5–5.1)
Sodium: 135 mmol/L (ref 135–145)

## 2023-03-27 LAB — TROPONIN I (HIGH SENSITIVITY): Troponin I (High Sensitivity): 4 ng/L (ref <18)

## 2023-03-27 NOTE — ED Triage Notes (Addendum)
Pt to ED from home for hypertension. Pt went to [facility] and checked his BP bc he felt "off" and had a headache. Pt BP at [facility] read 190/90. He assessed it twice, and then came here. Pt does have PCP, has not seen in a uear and has family HX of HTN. Pt is CAOx4, in no acute distress and ambulatory in triage. 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

## 2023-04-04 ENCOUNTER — Telehealth: Payer: Self-pay | Admitting: Family Medicine

## 2023-04-04 DIAGNOSIS — Z125 Encounter for screening for malignant neoplasm of prostate: Secondary | ICD-10-CM

## 2023-04-04 DIAGNOSIS — Z13220 Encounter for screening for lipoid disorders: Secondary | ICD-10-CM

## 2023-04-04 DIAGNOSIS — R7303 Prediabetes: Secondary | ICD-10-CM

## 2023-04-04 DIAGNOSIS — Z0000 Encounter for general adult medical examination without abnormal findings: Secondary | ICD-10-CM

## 2023-04-04 NOTE — Telephone Encounter (Signed)
-----   Message from Alvina Chou sent at 03/29/2023  9:15 AM EDT ----- Regarding: Lab orders for Tuesday, 5.28.24 Patient is scheduled for CPX labs, please order future labs, Thanks , Camelia Eng

## 2023-04-05 ENCOUNTER — Other Ambulatory Visit (INDEPENDENT_AMBULATORY_CARE_PROVIDER_SITE_OTHER): Payer: Self-pay

## 2023-04-05 ENCOUNTER — Other Ambulatory Visit: Payer: Self-pay | Admitting: Family Medicine

## 2023-04-05 DIAGNOSIS — R7303 Prediabetes: Secondary | ICD-10-CM

## 2023-04-05 DIAGNOSIS — Z0000 Encounter for general adult medical examination without abnormal findings: Secondary | ICD-10-CM

## 2023-04-05 DIAGNOSIS — Z125 Encounter for screening for malignant neoplasm of prostate: Secondary | ICD-10-CM

## 2023-04-05 DIAGNOSIS — Z13220 Encounter for screening for lipoid disorders: Secondary | ICD-10-CM

## 2023-04-05 LAB — COMPREHENSIVE METABOLIC PANEL
ALT: 22 U/L (ref 0–53)
AST: 16 U/L (ref 0–37)
Albumin: 4.4 g/dL (ref 3.5–5.2)
Alkaline Phosphatase: 52 U/L (ref 39–117)
BUN: 17 mg/dL (ref 6–23)
CO2: 26 mEq/L (ref 19–32)
Calcium: 9.5 mg/dL (ref 8.4–10.5)
Chloride: 99 mEq/L (ref 96–112)
Creatinine, Ser: 0.94 mg/dL (ref 0.40–1.50)
GFR: 99.67 mL/min (ref >60.00)
Glucose, Bld: 101 mg/dL — ABNORMAL HIGH (ref 70–99)
Potassium: 4.7 mEq/L (ref 3.5–5.1)
Sodium: 136 mEq/L (ref 135–145)
Total Bilirubin: 0.5 mg/dL (ref 0.2–1.2)
Total Protein: 6.9 g/dL (ref 6.0–8.3)

## 2023-04-05 LAB — CBC WITH DIFFERENTIAL/PLATELET
Basophils Absolute: 0 10*3/uL (ref 0.0–0.1)
Basophils Relative: 0.7 % (ref 0.0–3.0)
Eosinophils Absolute: 0.1 10*3/uL (ref 0.0–0.7)
Eosinophils Relative: 1.2 % (ref 0.0–5.0)
HCT: 42.1 % (ref 39.0–52.0)
Hemoglobin: 13.8 g/dL (ref 13.0–17.0)
Lymphocytes Relative: 34.2 % (ref 12.0–46.0)
Lymphs Abs: 1.9 10*3/uL (ref 0.7–4.0)
MCHC: 32.7 g/dL (ref 30.0–36.0)
MCV: 85.1 fl (ref 78.0–100.0)
Monocytes Absolute: 0.6 10*3/uL (ref 0.1–1.0)
Monocytes Relative: 10 % (ref 3.0–12.0)
Neutro Abs: 3 10*3/uL (ref 1.4–7.7)
Neutrophils Relative %: 53.9 % (ref 43.0–77.0)
Platelets: 264 10*3/uL (ref 150.0–400.0)
RBC: 4.94 Mil/uL (ref 4.22–5.81)
RDW: 14.1 % (ref 11.5–15.5)
WBC: 5.6 10*3/uL (ref 4.0–10.5)

## 2023-04-05 LAB — TSH: TSH: 2.42 u[IU]/mL (ref 0.35–5.50)

## 2023-04-05 LAB — LIPID PANEL
Cholesterol: 182 mg/dL (ref 0–200)
HDL: 53.6 mg/dL (ref >39.00)
LDL Cholesterol: 114 mg/dL — ABNORMAL HIGH (ref 0–99)
NonHDL: 127.91
Total CHOL/HDL Ratio: 3
Triglycerides: 69 mg/dL (ref 0.0–149.0)
VLDL: 13.8 mg/dL (ref 0.0–40.0)

## 2023-04-05 LAB — PSA: PSA: 0.19 ng/mL (ref 0.10–4.00)

## 2023-04-05 LAB — HEMOGLOBIN A1C: Hgb A1c MFr Bld: 5.7 % (ref 4.6–6.5)

## 2023-04-14 ENCOUNTER — Encounter: Payer: Self-pay | Admitting: Family Medicine

## 2023-04-14 ENCOUNTER — Ambulatory Visit (INDEPENDENT_AMBULATORY_CARE_PROVIDER_SITE_OTHER): Payer: Self-pay | Admitting: Family Medicine

## 2023-04-14 VITALS — BP 145/88 | HR 104 | Temp 97.9°F | Ht 65.25 in | Wt 271.4 lb

## 2023-04-14 DIAGNOSIS — Z125 Encounter for screening for malignant neoplasm of prostate: Secondary | ICD-10-CM

## 2023-04-14 DIAGNOSIS — Z13220 Encounter for screening for lipoid disorders: Secondary | ICD-10-CM | POA: Diagnosis not present

## 2023-04-14 DIAGNOSIS — G4733 Obstructive sleep apnea (adult) (pediatric): Secondary | ICD-10-CM

## 2023-04-14 DIAGNOSIS — Z0000 Encounter for general adult medical examination without abnormal findings: Secondary | ICD-10-CM | POA: Diagnosis not present

## 2023-04-14 DIAGNOSIS — R030 Elevated blood-pressure reading, without diagnosis of hypertension: Secondary | ICD-10-CM | POA: Diagnosis not present

## 2023-04-14 DIAGNOSIS — R7303 Prediabetes: Secondary | ICD-10-CM

## 2023-04-14 NOTE — Assessment & Plan Note (Signed)
MGF with prostate cancer  No symptoms Lab Results  Component Value Date   PSA 0.19 04/05/2023

## 2023-04-14 NOTE — Assessment & Plan Note (Signed)
Disc goals for lipids and reasons to control them Rev last labs with pt Rev low sat fat diet in detail  HDL and LDL both improved

## 2023-04-14 NOTE — Progress Notes (Signed)
Subjective:    Patient ID: Joseph Maddox, male    DOB: May 15, 1981, 42 y.o.   MRN: [id number]  HPI Here for health maintenance exam and to review chronic medical problems    Wt Readings from Last 3 Encounters:  04/14/23 271 lb 6 oz (123.1 kg)  03/27/23 275 lb (124.7 kg)  02/24/22 274 lb (124.3 kg)   44.81 kg/m  Vitals:   04/14/23 1053 04/14/23 1128  BP: (!) 148/94 (!) 145/88  Pulse: (!) 104   Temp: 97.9 F (36.6 C)   SpO2: 98%    Doing well Working  His daughter graduates this weekend   Feeling good overall   Had a bad time a year ago- lost a friend  He had a hard time with that and then gained a bunch of weight- got up to 310-320 lb After first of the year he serious about eating properly  Is down 40-50 lb   No exercise   Immunization History  Administered Date(s) Administered   Influenza Split 08/31/2011   Influenza,inj,Quad PF,6+ Mos 08/29/2013, 01/13/2018, 11/12/2019, 08/11/2020   PFIZER(Purple Top)SARS-COV-2 Vaccination 04/30/2020, 05/21/2020   Tdap 01/13/2018   Health Maintenance Due  Topic Date Due   HIV Screening  Never done   Hepatitis C Screening  Never done    STD screening - declines/non need   Tdap utd   Family h/o HTN  MGF had prostate cancer   Prostate health Lab Results  Component Value Date   PSA 0.19 04/05/2023   Mood H/o anxiety  Buspar 7.5 mg bid - he is only needing 1/2 pill at night for the past year  Does use headspace for meditation       04/14/2023   10:57 AM 02/24/2022    2:23 PM  Depression screen PHQ 2/9  Decreased Interest 0 0  Down, Depressed, Hopeless 0 0  PHQ - 2 Score 0 0  Altered sleeping 0 0  Tired, decreased energy 0 0  Change in appetite 0 0  Feeling bad or failure about yourself  2 0  Trouble concentrating 0 1  Moving slowly or fidgety/restless 0 0  Suicidal thoughts 0 0  PHQ-9 Score 2 1  Difficult doing work/chores Not difficult at all Not difficult at all    Had a high bp at [facility]- was not  accurate - some very high readings He went to the ER (chest felt weird/ arm)  In ER 124/82  No more chest symptoms- turned out to be a strained muscle from driving     Has OSA Cpap  Has prediabetes Lab Results  Component Value Date   HGBA1C 5.7 04/05/2023  Improved    Cholesterol Lab Results  Component Value Date   CHOL 182 04/05/2023   CHOL 182 02/24/2022   CHOL 144 08/11/2020   Lab Results  Component Value Date   HDL 53.60 04/05/2023   HDL 43.10 02/24/2022   HDL 36.80 (L) 08/11/2020   Lab Results  Component Value Date   LDLCALC 114 (H) 04/05/2023   LDLCALC 124 (H) 02/24/2022   LDLCALC 80 05/25/2012   Lab Results  Component Value Date   TRIG 69.0 04/05/2023   TRIG 75.0 02/24/2022   TRIG 215.0 (H) 08/11/2020   Lab Results  Component Value Date   CHOLHDL 3 04/05/2023   CHOLHDL 4 02/24/2022   CHOLHDL 4 08/11/2020   Lab Results  Component Value Date   LDLDIRECT 76.0 08/11/2020    Other labs Last metabolic panel  CMP     Component Value Date/Time   NA 136 04/05/2023 0856   NA 139 02/03/2015 2118   K 4.7 04/05/2023 0856   K 3.8 02/03/2015 2118   CL 99 04/05/2023 0856   CL 104 02/03/2015 2118   CO2 26 04/05/2023 0856   CO2 29 02/03/2015 2118   GLUCOSE 101 (H) 04/05/2023 0856   GLUCOSE 104 (H) 02/03/2015 2118   BUN 17 04/05/2023 0856   BUN 15 02/03/2015 2118   CREATININE 0.94 04/05/2023 0856   CREATININE 1.13 02/03/2015 2118   CALCIUM 9.5 04/05/2023 0856   CALCIUM 9.9 02/03/2015 2118   PROT 6.9 04/05/2023 0856   ALBUMIN 4.4 04/05/2023 0856   AST 16 04/05/2023 0856   ALT 22 04/05/2023 0856   ALKPHOS 52 04/05/2023 0856   BILITOT 0.5 04/05/2023 0856   GFR 99.67 04/05/2023 0856   GFRNONAA >60 03/27/2023 1511   GFRNONAA >60 02/03/2015 2118     Lab Results  Component Value Date   TSH 2.42 04/05/2023   Lab Results  Component Value Date   WBC 5.6 04/05/2023   HGB 13.8 04/05/2023   HCT 42.1 04/05/2023   MCV 85.1 04/05/2023   PLT  264.0 04/05/2023   Watching diet  Eating more protein/ meat Has cut out sugar and bread   Not enough produce   Patient Active Problem List   Diagnosis Date Noted   Elevated blood pressure reading 04/14/2023   Prostate cancer screening 04/04/2023   Lipid screening 02/24/2022   Rhinitis, chronic 11/14/2020   Encounter for screening for lipoid disorders 08/11/2020   Encounter for general adult medical examination with abnormal findings 05/09/2018   Nasal congestion 01/13/2018   Post-nasal drip 01/15/2015   OSA (obstructive sleep apnea) 02/16/2013   Morbid obesity (HCC) 02/16/2013   Quit smoking 03/01/2012   Prediabetes 07/20/2011   Anxiety 07/06/2011   Past Medical History:  Diagnosis Date   Allergic rhinitis    Sleep apnea    History reviewed. No pertinent surgical history. Social History   Tobacco Use   Smoking status: Former    Packs/day: 0.75    Years: 6.00    Additional pack years: 0.00    Total pack years: 4.50    Types: Cigars, Cigarettes    Quit date: 12/11/2012    Years since quitting: 10.3   Smokeless tobacco: Never  Substance Use Topics   Alcohol use: Yes    Alcohol/week: 0.0 standard drinks of alcohol    Comment: occassionally   Drug use: No   Family History  Problem Relation Age of Onset   Hypertension Other        Fam hx   Hypertension Mother    Hypertension Father    Stroke Maternal Grandmother    Cancer Maternal Grandfather        prostate CA   No Known Allergies Current Outpatient Medications on File Prior to Visit  Medication Sig Dispense Refill   busPIRone (BUSPAR) 15 MG tablet TAKE 1/2 TABLET BY MOUTH TWICE DAILY AS NEEDED 30 tablet 0   cetirizine (ZYRTEC) 10 MG tablet Take 10 mg by mouth daily.     chlorpheniramine (CHLOR-TRIMETON) 4 MG tablet chlorpheniramine (aka Chlor tabs) 4 mg tablet (1 to 2 tablets at night) for management of allergies and postnasal drip at night as needed.This medication is sedating 45 tablet 2   fluticasone  (FLONASE) 50 MCG/ACT nasal spray Place 2 sprays into both nostrils 2 (two) times daily. 32 mL 0   No  current facility-administered medications on file prior to visit.    Review of Systems  Constitutional:  Negative for activity change, appetite change, fatigue, fever and unexpected weight change.  HENT:  Negative for congestion, rhinorrhea, sore throat and trouble swallowing.   Eyes:  Negative for pain, redness, itching and visual disturbance.  Respiratory:  Negative for cough, chest tightness, shortness of breath and wheezing.   Cardiovascular:  Negative for chest pain and palpitations.  Gastrointestinal:  Negative for abdominal pain, blood in stool, constipation, diarrhea and nausea.  Endocrine: Negative for cold intolerance, heat intolerance, polydipsia and polyuria.  Genitourinary:  Negative for difficulty urinating, dysuria, frequency and urgency.  Musculoskeletal:  Negative for arthralgias, joint swelling and myalgias.  Skin:  Negative for pallor and rash.  Neurological:  Negative for dizziness, tremors, weakness, numbness and headaches.  Hematological:  Negative for adenopathy. Does not bruise/bleed easily.  Psychiatric/Behavioral:  Negative for decreased concentration and dysphoric mood. The patient is not nervous/anxious.        Occ anxiety       Objective:   Physical Exam Constitutional:      General: He is not in acute distress.    Appearance: Normal appearance. He is well-developed. He is obese. He is not ill-appearing or diaphoretic.  HENT:     Head: Normocephalic and atraumatic.     Right Ear: Tympanic membrane, ear canal and external ear normal.     Left Ear: Tympanic membrane, ear canal and external ear normal.     Nose: Nose normal. No congestion.     Mouth/Throat:     Mouth: Mucous membranes are moist.     Pharynx: Oropharynx is clear. No posterior oropharyngeal erythema.  Eyes:     General: No scleral icterus.       Right eye: No discharge.        Left eye: No  discharge.     Conjunctiva/sclera: Conjunctivae normal.     Pupils: Pupils are equal, round, and reactive to light.  Neck:     Thyroid: No thyromegaly.     Vascular: No carotid bruit or JVD.  Cardiovascular:     Rate and Rhythm: Normal rate and regular rhythm.     Pulses: Normal pulses.     Heart sounds: Normal heart sounds.     No gallop.  Pulmonary:     Effort: Pulmonary effort is normal. No respiratory distress.     Breath sounds: Normal breath sounds. No wheezing or rales.     Comments: Good air exch Chest:     Chest wall: No tenderness.  Abdominal:     General: Bowel sounds are normal. There is no distension or abdominal bruit.     Palpations: Abdomen is soft. There is no mass.     Tenderness: There is no abdominal tenderness.     Hernia: No hernia is present.  Musculoskeletal:        General: No tenderness.     Cervical back: Normal range of motion and neck supple. No rigidity. No muscular tenderness.     Right lower leg: No edema.     Left lower leg: No edema.  Lymphadenopathy:     Cervical: No cervical adenopathy.  Skin:    General: Skin is warm and dry.     Coloration: Skin is not pale.     Findings: No erythema or rash.  Neurological:     Mental Status: He is alert.     Cranial Nerves: No cranial nerve deficit.  Motor: No abnormal muscle tone.     Coordination: Coordination normal.     Gait: Gait normal.     Deep Tendon Reflexes: Reflexes are normal and symmetric. Reflexes normal.  Psychiatric:        Mood and Affect: Mood normal.        Cognition and Memory: Cognition normal.           Assessment & Plan:   Problem List Items Addressed This Visit       Respiratory   OSA (obstructive sleep apnea)    Continues cpap        Other   Prostate cancer screening    MGF with prostate cancer  No symptoms Lab Results  Component Value Date   PSA 0.19 04/05/2023         Prediabetes    Lab Results  Component Value Date   HGBA1C 5.7 04/05/2023    disc imp of low glycemic diet and wt loss to prevent DM2   Doing better and has lost wt      Morbid obesity (HCC)    Discussed how this problem influences overall health and the risks it imposes  Reviewed plan for weight loss with lower calorie diet (via better food choices and also portion control or program like weight watchers) and exercise building up to or more than 30 minutes 5 days per week including some aerobic activity  Commended wt lost so far       Lipid screening    Disc goals for lipids and reasons to control them Rev last labs with pt Rev low sat fat diet in detail  HDL and LDL both improved      Encounter for general adult medical examination with abnormal findings - Primary    Reviewed health habits including diet and exercise and skin cancer prevention New finding is elevated bp Reviewed appropriate screening tests for age  Also reviewed health mt list, fam hx and immunization status , as well as social and family history   See HPI Labs reviewed and ordered Declines STD screen, low risk  Discussed risks of HTN and fam hx - will follow up for elevated bp  Psa is normal  Mood is stable with phq of 0 Encouraged continued use of cpap      Elevated blood pressure reading    BP: (!) 145/88   Will watch this  Discussed checking bp at home/correct way  If he gets a cuff will bring to next appt F/u in 2-3 months

## 2023-04-14 NOTE — Assessment & Plan Note (Signed)
Discussed how this problem influences overall health and the risks it imposes  Reviewed plan for weight loss with lower calorie diet (via better food choices and also portion control or program like weight watchers) and exercise building up to or more than 30 minutes 5 days per week including some aerobic activity   Commended wt lost so far  

## 2023-04-14 NOTE — Patient Instructions (Addendum)
Aim to start some exercise- work up to at least 30 minute 5 days per week   Get heart rate up -walking/ biking  Add some strength training to your routine, this is important for bone and brain health and can reduce your risk of falls and help your body use insulin properly and regulate weight  Light weights, exercise bands , and internet videos are a good way to start  Yoga (chair or regular), machines , floor exercises or a gym with machines are also good options    Continue diet low in processed foods Try to get most of your carbohydrates from produce (with the exception of white potatoes)  Eat less bread/pasta/rice/snack foods/cereals/sweets and other items from the middle of the grocery store (processed carbs)  If you want to check BP at home I like the OMRON cuff for the arm size large  Check when relaxed , with both feet on the floor and arm supported at heart level   Bring cuff to the next appt in 2-3 months

## 2023-04-14 NOTE — Assessment & Plan Note (Signed)
Continues cpap.  

## 2023-04-14 NOTE — Assessment & Plan Note (Signed)
Lab Results  Component Value Date   HGBA1C 5.7 04/05/2023    disc imp of low glycemic diet and wt loss to prevent DM2   Doing better and has lost wt

## 2023-04-14 NOTE — Assessment & Plan Note (Addendum)
Reviewed health habits including diet and exercise and skin cancer prevention New finding is elevated bp Reviewed appropriate screening tests for age  Also reviewed health mt list, fam hx and immunization status , as well as social and family history   See HPI Labs reviewed and ordered Declines STD screen, low risk  Discussed risks of HTN and fam hx - will follow up for elevated bp  Psa is normal  Mood is stable with phq of 0 Encouraged continued use of cpap

## 2023-04-14 NOTE — Assessment & Plan Note (Signed)
BP: (!) 145/88   Will watch this  Discussed checking bp at home/correct way  If he gets a cuff will bring to next appt F/u in 2-3 months

## 2023-04-18 ENCOUNTER — Other Ambulatory Visit: Payer: Self-pay | Admitting: Family Medicine

## 2023-04-28 ENCOUNTER — Other Ambulatory Visit: Payer: Self-pay | Admitting: Family Medicine

## 2023-06-23 ENCOUNTER — Encounter (INDEPENDENT_AMBULATORY_CARE_PROVIDER_SITE_OTHER): Payer: Self-pay

## 2023-07-15 ENCOUNTER — Ambulatory Visit: Payer: Managed Care, Other (non HMO) | Admitting: Family Medicine

## 2023-08-02 ENCOUNTER — Other Ambulatory Visit: Payer: Self-pay | Admitting: Family Medicine

## 2023-08-02 MED ORDER — BUSPIRONE HCL 15 MG PO TABS: 15.0000 mg | ORAL_TABLET | Freq: Two times a day (BID) | ORAL | 0 refills | Status: DC

## 2023-08-02 NOTE — Telephone Encounter (Signed)
Yes he was suppose to f/u with Dr. Milinda Antis in Aug or Sept, please call and r/s as soon as he can get in, thanks

## 2023-08-02 NOTE — Telephone Encounter (Signed)
Pt r/s BP f/u to 10/25/23. Does he need appt sooner?

## 2023-08-02 NOTE — Telephone Encounter (Signed)
Spoke to pt, r/s ov to 09/01/23

## 2023-08-02 NOTE — Telephone Encounter (Signed)
Pt is overdue for his 2-3 month BP f/u from his CPE he had in June, please schedule and then route back to me to refill, thanks

## 2023-09-02 ENCOUNTER — Ambulatory Visit: Payer: Managed Care, Other (non HMO) | Admitting: Family Medicine

## 2023-09-09 ENCOUNTER — Other Ambulatory Visit: Payer: Self-pay | Admitting: Family Medicine

## 2023-10-03 ENCOUNTER — Ambulatory Visit: Payer: Self-pay | Admitting: Family Medicine

## 2023-10-06 IMAGING — CR DG CERVICAL SPINE COMPLETE 4+V
7 series · 7 of 7 positions shown · non-contrast
Comparison: Cervical spine radiograph dated 10/22/2020.

CLINICAL DATA: Fall and neck pain.

EXAM:
CERVICAL SPINE - COMPLETE 4+ VIEW

[c-spine lat]
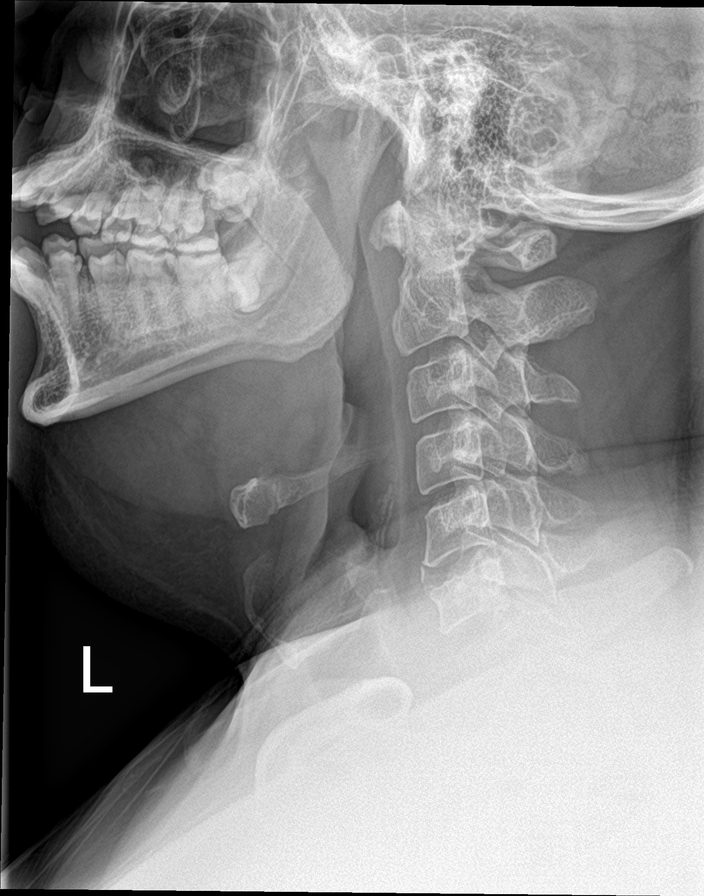

[c-spine obl (1 of 2)]
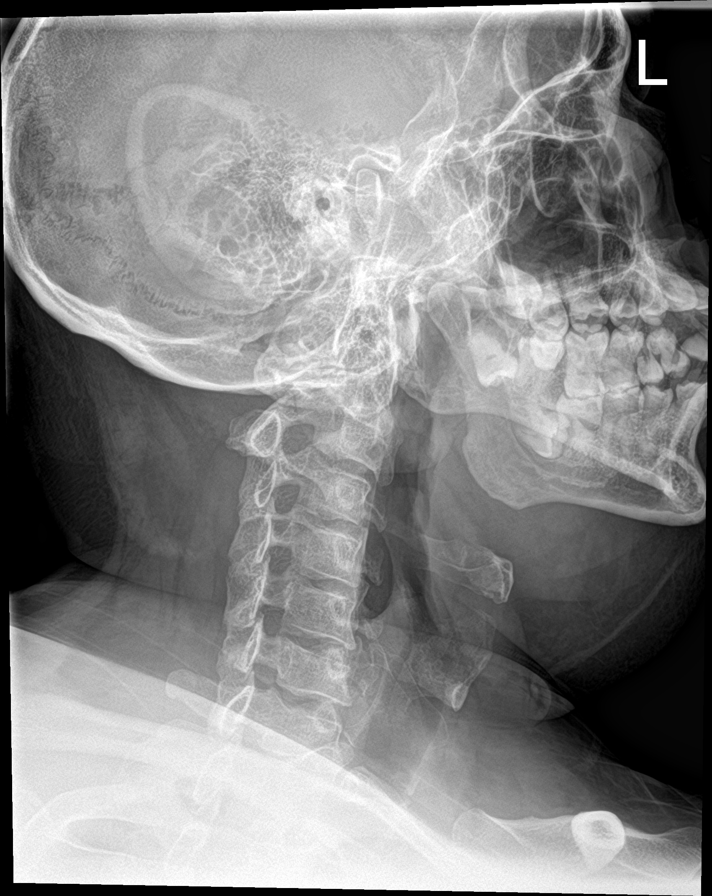

[c-spine obl (2 of 2)]
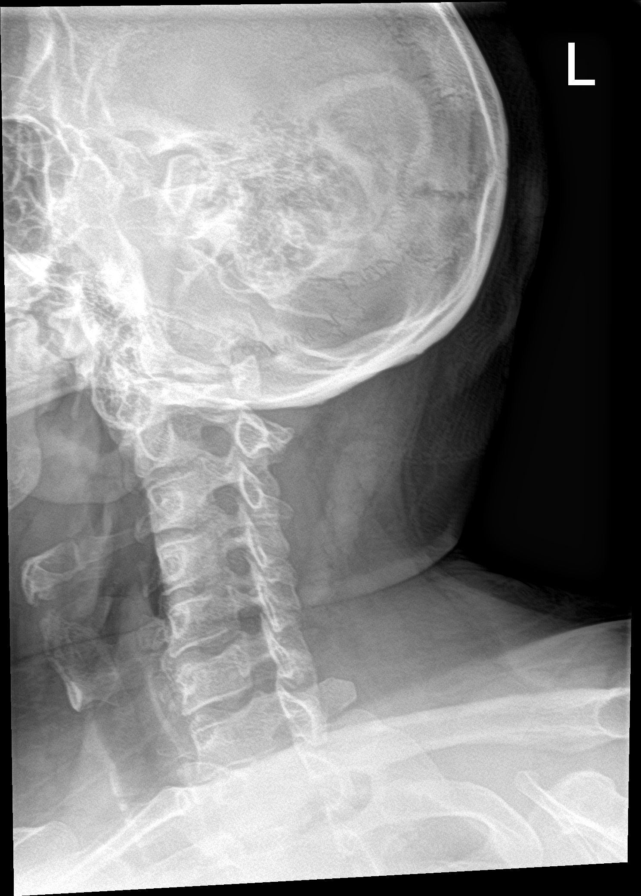

[c-spine ap]
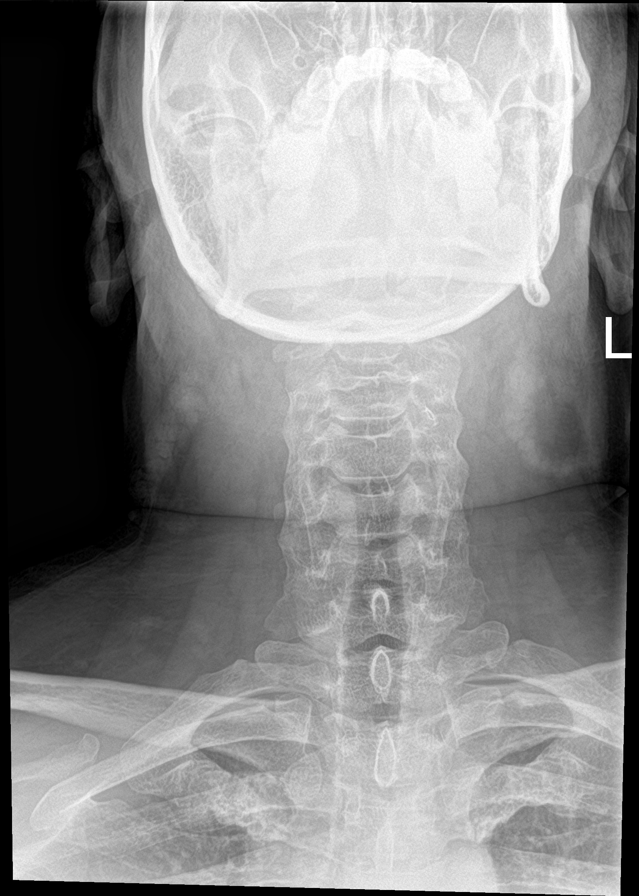

[c-spine open mouth]
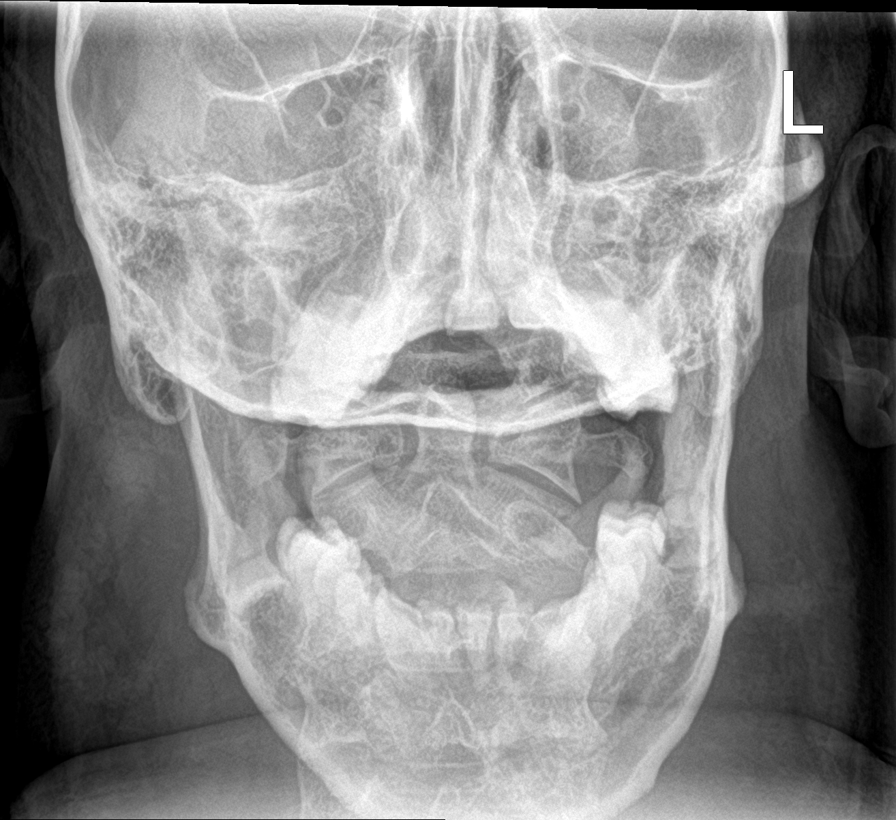

[[person_name]]
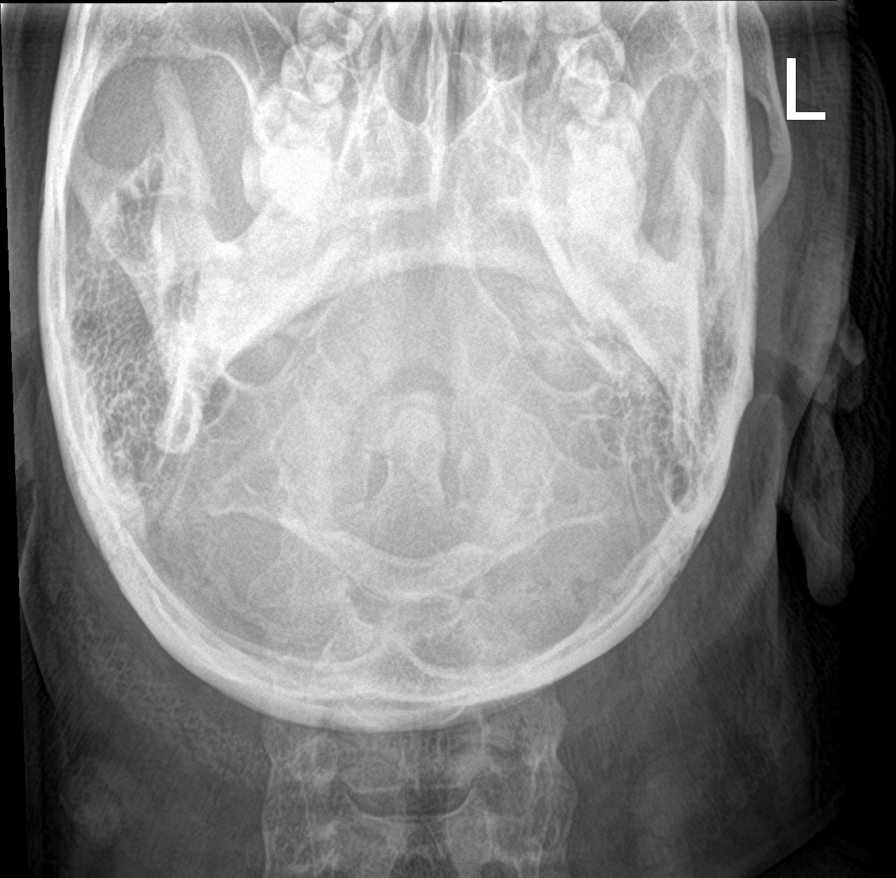

[c-spine swimmers]
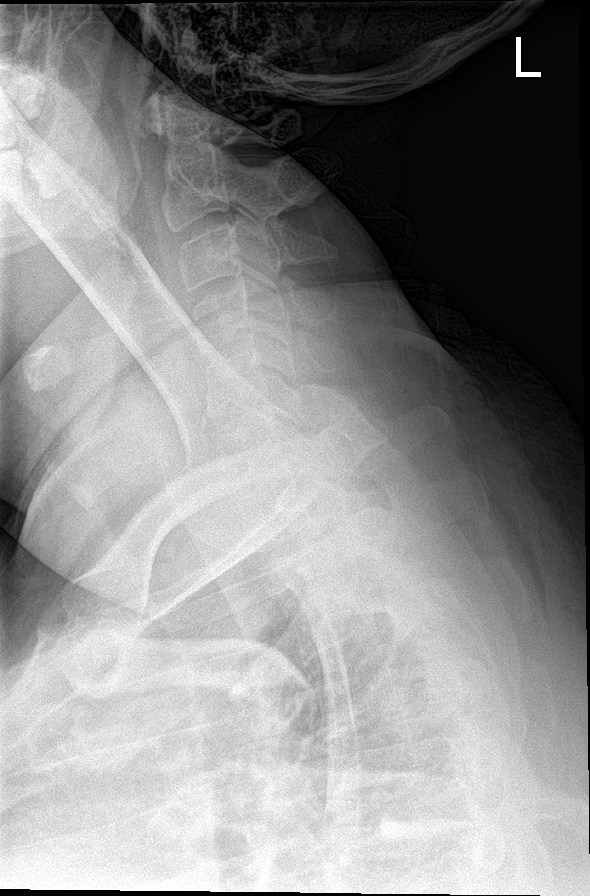

[7 of 7 positions shown; findings below may reference images not displayed]

FINDINGS: There is no acute fracture or subluxation of the cervical spine. The
vertebral body heights and disc spaces are maintained. The
visualized posterior elements and the odontoid appear intact. There
is anatomic alignment of the lateral masses of C1 and C2. The soft
tissues are unremarkable.
IMPRESSION: No acute/traumatic cervical spine pathology.

## 2023-10-25 ENCOUNTER — Ambulatory Visit: Payer: Managed Care, Other (non HMO) | Admitting: Family Medicine

## 2024-01-27 ENCOUNTER — Ambulatory Visit: Payer: Self-pay | Admitting: Family Medicine

## 2024-05-09 ENCOUNTER — Other Ambulatory Visit: Payer: Self-pay | Admitting: Family Medicine

## 2024-05-09 NOTE — Telephone Encounter (Unsigned)
 Copied from CRM [id number]. Topic: Clinical - Medication Refill >> May 09, 2024  2:38 PM Adrionna Y wrote: Medication: fluticasone  (FLONASE ) 50 MCG/ACT nasal spray   Has the patient contacted their pharmacy? Yes (Agent: If no, request that the patient contact the pharmacy for the refill. If patient does not wish to contact the pharmacy document the reason why and proceed with request.) (Agent: If yes, when and what did the pharmacy advise?)  This is the patient's preferred pharmacy:  [facility] [phone] [facility] [place] - [address] [place] [address] Phone: [phone] Fax: [phone]    Is this the correct pharmacy for this prescription? Yes If no, delete pharmacy and type the correct one.   Has the prescription been filled recently? No  Is the patient out of the medication? Yes  Has the patient been seen for an appointment in the last year OR does the patient have an upcoming appointment? No  Can we respond through MyChart? Yes  Agent: Please be advised that Rx refills may take up to 3 business days. We ask that you follow-up with your pharmacy.

## 2024-05-10 MED ORDER — FLUTICASONE PROPIONATE 50 MCG/ACT NA SUSP: 2.0000 | Freq: Two times a day (BID) | NASAL | 0 refills | Status: DC

## 2024-05-10 NOTE — Telephone Encounter (Signed)
 Spoke to pt, pt stated he has a very busy sch, wouldn't be able to visit office until late Sept/Oct. Sch cpe for 08/03/24

## 2024-05-10 NOTE — Telephone Encounter (Signed)
 Pt is overdue for his CPE (labs prior), please schedule and route back to me to refill. Thanks

## 2024-05-10 NOTE — Telephone Encounter (Signed)
Med refilled once  

## 2024-07-04 ENCOUNTER — Other Ambulatory Visit: Payer: Self-pay | Admitting: Family Medicine

## 2024-08-03 ENCOUNTER — Encounter: Payer: Self-pay | Admitting: Family Medicine

## 2024-11-05 ENCOUNTER — Encounter: Payer: Self-pay | Admitting: Family Medicine

## 2024-11-27 ENCOUNTER — Other Ambulatory Visit: Payer: Self-pay | Admitting: Family Medicine

## 2024-12-10 ENCOUNTER — Other Ambulatory Visit: Payer: Self-pay | Admitting: Family Medicine

## 2025-01-16 ENCOUNTER — Encounter: Payer: Self-pay | Admitting: Family Medicine
